# Patient Record
Sex: Female | Born: 1950 | Race: White | Hispanic: No | Marital: Single | Smoking: Former smoker
Health system: Southern US, Community
[De-identification: ages and names within clinical notes are randomized; demographics above are authoritative.]

## PROBLEM LIST (undated history)

## (undated) DIAGNOSIS — K859 Acute pancreatitis without necrosis or infection, unspecified: Secondary | ICD-10-CM

## (undated) DIAGNOSIS — K469 Unspecified abdominal hernia without obstruction or gangrene: Secondary | ICD-10-CM

## (undated) DIAGNOSIS — E119 Type 2 diabetes mellitus without complications: Secondary | ICD-10-CM

## (undated) HISTORY — DX: Type 2 diabetes mellitus without complications: E11.9

## (undated) HISTORY — PX: ABDOMINAL HYSTERECTOMY: SHX81

## (undated) HISTORY — PX: BASAL CELL CARCINOMA EXCISION: SHX1214

## (undated) HISTORY — DX: Acute pancreatitis without necrosis or infection, unspecified: K85.90

## (undated) HISTORY — DX: Unspecified abdominal hernia without obstruction or gangrene: K46.9

---

## 2003-11-12 ENCOUNTER — Other Ambulatory Visit: Admission: RE | Admit: 2003-11-12 | Discharge: 2003-11-12 | Payer: Self-pay | Admitting: Obstetrics and Gynecology

## 2004-02-11 ENCOUNTER — Ambulatory Visit (HOSPITAL_COMMUNITY): Admission: RE | Admit: 2004-02-11 | Discharge: 2004-02-11 | Payer: Self-pay | Admitting: Obstetrics and Gynecology

## 2004-05-04 ENCOUNTER — Observation Stay (HOSPITAL_COMMUNITY): Admission: RE | Admit: 2004-05-04 | Discharge: 2004-05-05 | Payer: Self-pay | Admitting: Obstetrics and Gynecology

## 2004-05-11 ENCOUNTER — Inpatient Hospital Stay (HOSPITAL_COMMUNITY): Admission: AD | Admit: 2004-05-11 | Discharge: 2004-05-16 | Payer: Self-pay | Admitting: Obstetrics and Gynecology

## 2006-06-21 ENCOUNTER — Encounter: Admission: RE | Admit: 2006-06-21 | Discharge: 2006-06-21 | Payer: Self-pay | Admitting: Obstetrics and Gynecology

## 2010-08-01 ENCOUNTER — Encounter: Payer: Self-pay | Admitting: Obstetrics and Gynecology

## 2011-10-25 ENCOUNTER — Other Ambulatory Visit: Payer: Self-pay | Admitting: Dermatology

## 2013-10-30 ENCOUNTER — Other Ambulatory Visit: Payer: Self-pay | Admitting: Dermatology

## 2014-04-07 ENCOUNTER — Other Ambulatory Visit: Payer: Self-pay | Admitting: Dermatology

## 2016-06-20 ENCOUNTER — Ambulatory Visit: Payer: Self-pay

## 2016-06-21 ENCOUNTER — Ambulatory Visit (INDEPENDENT_AMBULATORY_CARE_PROVIDER_SITE_OTHER): Payer: BLUE CROSS/BLUE SHIELD

## 2016-06-21 ENCOUNTER — Ambulatory Visit (INDEPENDENT_AMBULATORY_CARE_PROVIDER_SITE_OTHER): Payer: BLUE CROSS/BLUE SHIELD | Admitting: Family Medicine

## 2016-06-21 ENCOUNTER — Telehealth: Payer: Self-pay | Admitting: Family Medicine

## 2016-06-21 ENCOUNTER — Encounter (HOSPITAL_COMMUNITY): Payer: Self-pay

## 2016-06-21 ENCOUNTER — Ambulatory Visit (HOSPITAL_COMMUNITY)
Admission: RE | Admit: 2016-06-21 | Discharge: 2016-06-21 | Disposition: A | Discharge status: Home / Self Care | Payer: BLUE CROSS/BLUE SHIELD | Source: Ambulatory Visit | Attending: Family Medicine | Admitting: Family Medicine

## 2016-06-21 VITALS — BP 128/68 | HR 104 | Temp 97.8°F | Resp 18 | Ht 64.0 in | Wt 192.2 lb

## 2016-06-21 DIAGNOSIS — R1032 Left lower quadrant pain: Secondary | ICD-10-CM

## 2016-06-21 DIAGNOSIS — R935 Abnormal findings on diagnostic imaging of other abdominal regions, including retroperitoneum: Secondary | ICD-10-CM | POA: Insufficient documentation

## 2016-06-21 DIAGNOSIS — K559 Vascular disorder of intestine, unspecified: Secondary | ICD-10-CM

## 2016-06-21 DIAGNOSIS — Q438 Other specified congenital malformations of intestine: Secondary | ICD-10-CM

## 2016-06-21 DIAGNOSIS — I998 Other disorder of circulatory system: Secondary | ICD-10-CM

## 2016-06-21 DIAGNOSIS — K529 Noninfective gastroenteritis and colitis, unspecified: Secondary | ICD-10-CM

## 2016-06-21 LAB — POCT CBC
Granulocyte percent: 76.2 %G (ref 37–80)
HCT, POC: 42.5 % (ref 37.7–47.9)
Hemoglobin: 15 g/dL (ref 12.2–16.2)
Lymph, poc: 1.6 (ref 0.6–3.4)
MCH: 30.2 pg (ref 27–31.2)
MCHC: 35.3 g/dL (ref 31.8–35.4)
MCV: 85.4 fL (ref 80–97)
MID (cbc): 0.9 (ref 0–0.9)
MPV: 7.4 fL (ref 0–99.8)
POC Granulocyte: 7.9 — AB (ref 2–6.9)
POC LYMPH PERCENT: 15.2 %L (ref 10–50)
POC MID %: 8.6 %M (ref 0–12)
Platelet Count, POC: 337 10*3/uL (ref 142–424)
RBC: 4.98 M/uL (ref 4.04–5.48)
RDW, POC: 13.6 %
WBC: 10.4 10*3/uL — AB (ref 4.6–10.2)

## 2016-06-21 LAB — COMPREHENSIVE METABOLIC PANEL
ALT: 14 IU/L (ref 0–32)
AST: 11 IU/L (ref 0–40)
Albumin/Globulin Ratio: 1.5 (ref 1.2–2.2)
Albumin: 4.7 g/dL (ref 3.6–4.8)
Alkaline Phosphatase: 68 IU/L (ref 39–117)
BILIRUBIN TOTAL: 0.5 mg/dL (ref 0.0–1.2)
BUN/Creatinine Ratio: 17 (ref 12–28)
BUN: 12 mg/dL (ref 8–27)
CO2: 25 mmol/L (ref 18–29)
Calcium: 9.6 mg/dL (ref 8.7–10.3)
Chloride: 101 mmol/L (ref 96–106)
Creatinine, Ser: 0.69 mg/dL (ref 0.57–1.00)
GFR calc Af Amer: 107 mL/min/{1.73_m2} (ref >59)
GFR calc non Af Amer: 93 mL/min/{1.73_m2} (ref >59)
GLUCOSE: 158 mg/dL — AB (ref 65–99)
Globulin, Total: 3.2 g/dL (ref 1.5–4.5)
Potassium: 4.1 mmol/L (ref 3.5–5.2)
Sodium: 138 mmol/L (ref 134–144)
Total Protein: 7.9 g/dL (ref 6.0–8.5)

## 2016-06-21 LAB — LIPASE: Lipase: 18 U/L (ref 14–72)

## 2016-06-21 LAB — AMYLASE: Amylase: 30 U/L — ABNORMAL LOW (ref 31–124)

## 2016-06-21 MED ORDER — IOPAMIDOL (ISOVUE-300) INJECTION 61%
INTRAVENOUS | Status: AC
Start: 2016-06-21 — End: 2016-06-21
  Administered 2016-06-21: 100 mL
  Filled 2016-06-21: qty 100

## 2016-06-21 MED ORDER — IOPAMIDOL (ISOVUE-300) INJECTION 61%
INTRAVENOUS | Status: AC
  Administered 2016-06-21: 30 mL via ORAL
  Filled 2016-06-21: qty 30

## 2016-06-21 MED ORDER — SODIUM CHLORIDE 0.9 % IJ SOLN
INTRAMUSCULAR | Status: AC
  Filled 2016-06-21: qty 50

## 2016-06-21 MED ORDER — TRAMADOL HCL 50 MG PO TABS: 100.0000 mg | ORAL_TABLET | Freq: Three times a day (TID) | ORAL | 0 refills | Status: DC | PRN

## 2016-06-21 NOTE — Patient Instructions (Signed)
     IF you received an x-ray today, you will receive an invoice from [facility]. Please contact [facility] at [phone] with questions or concerns regarding your invoice.   IF you received labwork today, you will receive an invoice from [facility]. Please contact [facility] at [phone] with questions or concerns regarding your invoice.   Our billing staff will not be able to assist you with questions regarding bills from these companies.  You will be contacted with the lab results as soon as they are available. The fastest way to get your results is to activate your My Chart account. Instructions are located on the last page of this paperwork. If you have not heard from us regarding the results in 2 weeks, please contact this office.      

## 2016-06-21 NOTE — Progress Notes (Signed)
Patient ID: Kimberly Brown, female    DOB: [birth year], 65 y.o.   MRN: [id number]  PCP: No primary care provider on file.  Chief Complaint  Patient presents with  . Abdominal Pain    started Saturday, worse Sunday, nauseated, haven't eaten since Sunday morning  lower left side    Subjective:   HPI 65 year old female presents for evaluation of abdominal pain x 4 days. Pt is new to [facility]. Reports LLQ pain aggravated by movement. She began to experience nausea on Sunday and placed her self on clear liquid diet only and mostly drinked only water. Reports abdomen distended. Last bowel movement Sunday regular without diarrhea. Bowels have not moved since. Reports a subjective fever Sunday night. She has only tolerated lying the bed over the last two days. Completely fatigued and sharp pains occur with any type of movement. Standing up in one position improves pain. Mother hx of rectal cancer with METS. Pt is 54 and reports no px colonoscopy.   Social History   Social History  . Marital status: Divorced    Spouse name: N/A  . Number of children: N/A  . Years of education: N/A   Occupational History  . Not on file.   Social History Main Topics  . Smoking status: Never Smoker  . Smokeless tobacco: Never Used  . Alcohol use Not on file  . Drug use: Unknown  . Sexual activity: Not on file   Other Topics Concern  . Not on file   Social History Narrative  . No narrative on file    History reviewed. No pertinent family history.  Review of Systems See HPI There are no active problems to display for this patient.  Prior to Admission medications   Not on File  No Known Allergies     Objective:  Physical Exam  Constitutional: She is oriented to person, place, and time. She appears well-developed and well-nourished.  HENT:  Head: Normocephalic.  Eyes: Conjunctivae and EOM are normal. Pupils are equal, round, and reactive to light.  Neck: Normal range of motion. Neck supple.   Cardiovascular: Regular rhythm, normal heart sounds and intact distal pulses.   Pulmonary/Chest: Breath sounds normal.  Abdominal: She exhibits distension. Bowel sounds are decreased. There is no hepatosplenomegaly. There is tenderness in the left lower quadrant. There is rigidity and rebound. There is no guarding, no CVA tenderness, no tenderness at McBurney's point and negative Murphy's sign. No hernia.    Left middle lower quadrant increased pain with light palpation  Musculoskeletal: Normal range of motion.  Neurological: She is alert and oriented to person, place, and time.  Skin: Skin is warm and dry.  Psychiatric: She has a normal mood and affect. Her behavior is normal. Judgment and thought content normal.    Vitals:   06/21/16 1001  BP: 128/68  Pulse: (!) 104  Resp: 18  Temp: 97.8 F (36.6 C)    Assessment & Plan:  1. Left lower quadrant pain - POCT CBC - Lipase - Amylase - DG Abd 2 Views; Future - Comprehensive metabolic panel - CT Abdomen Pelvis W Contrast Ct Abdomen Pelvis W Contrast  Result Date: 06/21/2016 CLINICAL DATA:  65 year old female with left lower quadrant pain aggravated by movement. Abdominal distention. Nausea. EXAM: CT ABDOMEN AND PELVIS WITH CONTRAST TECHNIQUE: Multidetector CT imaging of the abdomen and pelvis was performed using the standard protocol following bolus administration of intravenous contrast. CONTRAST:  1 ISOVUE-300 IOPAMIDOL (ISOVUE-300) INJECTION 61% COMPARISON:  CT  the abdomen and pelvis 05/12/2004. FINDINGS: Lower chest: Unremarkable. Hepatobiliary: No cystic or solid hepatic lesions. No intra or extrahepatic biliary ductal dilatation. Gallbladder is normal in appearance. Pancreas: No pancreatic mass. No pancreatic ductal dilatation. No pancreatic or peripancreatic fluid or inflammatory changes. Spleen: Unremarkable. Adrenals/Urinary Tract: 2.6 cm low-attenuation lesion in the lower pole the left kidney is compatible with a simple cyst.  subcentimeter low-attenuation lesion in the interpolar region of the left kidney is too small to characterize, but is statistically likely to represent a tiny cyst. Right kidney and bilateral adrenal glands are normal in appearance. There is no hydroureteronephrosis or perinephric stranding to indicate urinary tract obstruction at this time. Urinary bladder is nearly completely decompressed, but otherwise unremarkable in appearance. Stomach/Bowel: The appearance of the stomach is normal. There is no pathologic dilatation of small bowel or colon. Normal appendix. In the left lower quadrant anterior to the distal descending colon there is an area of fatty attenuation circumscribed of by some inflammatory changes, best appreciated on axial image 48 of series 2 and coronal image 33 of series 3, most compatible with an area of fat necrosis, presumably from epiploic appendagitis. Vascular/Lymphatic: No significant atherosclerotic disease, aneurysm or dissection identified in the abdominal or pelvic vasculature. No lymphadenopathy noted in the abdomen or pelvis. Reproductive: Status post hysterectomy. Ovaries are not confidently identified may be surgically absent or atrophic. Other: No significant volume of ascites.  No pneumoperitoneum. Musculoskeletal: There are no aggressive appearing lytic or blastic lesions noted in the visualized portions of the skeleton. IMPRESSION: 1. Findings in the left lower quadrant are most compatible with inflammation and fat necrosis secondary to epiploic appendagitis, as detailed above. 2. Normal appendix. 3. Additional incidental findings, as above. Electronically Signed   By: Vinnie Langton M.D.   On: 06/21/2016 16:47   Dg Abd 2 Views  Result Date: 06/21/2016 CLINICAL DATA:  65 year old female with left lower quadrant abdominal pain. Initial encounter. EXAM: ABDOMEN - 2 VIEW COMPARISON:  CT Abdomen and Pelvis 05/12/2004. FINDINGS: Upright and supine views. Lung bases appear within  normal limits. No pneumoperitoneum. Non obstructed bowel gas pattern. Abdominal and pelvic visceral contours are within normal limits. Lower lumbar facet hypertrophy. No acute osseous abnormality identified. IMPRESSION: Negative.  Normal bowel gas pattern, no free air. Electronically Signed   By: Genevie Ann M.D.   On: 06/21/2016 11:14   - Ambulatory referral to General Surgery- due to CT noted  Epiploic Appendagitis with fat necrosis. Further evaluation by G.S needed. -Pain Management -Tramadol 50 mg every 8 hours as needed.  Carroll Sage. Kenton Kingfisher, MSN, FNP-C [facility]

## 2016-06-22 ENCOUNTER — Telehealth: Payer: Self-pay

## 2016-06-22 NOTE — Telephone Encounter (Signed)
erroneous

## 2016-06-22 NOTE — Telephone Encounter (Signed)
Uncertain of what happened to my original phone note. Pt was contacted and notified of CT scan results on 06/21/16 after 7:00 pm. She was advise at that time that I was urgently referring her to General Surgery due to epiploic appendagitis which was noted on her CT scan. We faxed over a prescription for Tramadol to her pharmacy which she was also notified of. I am out of the office tomorrow and only saw this note as I am completing my charts. Could you please phone patient and determine whether or not she had picked up the tramadol. Also I advised her if she hasn't heard back from General Surgery by Thursday to please ask to speak with Candie Chroman for follow-up. This could have been taken care of earlier today but this message was sent to the pool oppose to being routed directly to me.  Carroll Sage. Kenton Kingfisher, MSN, FNP-C [facility]

## 2016-06-22 NOTE — Telephone Encounter (Signed)
Pt is wanting to know that with the mri results can she take motrin for pain   Best number [phone]

## 2016-06-23 ENCOUNTER — Telehealth: Payer: Self-pay | Admitting: Emergency Medicine

## 2016-06-23 LAB — COMPREHENSIVE METABOLIC PANEL

## 2016-06-23 NOTE — Telephone Encounter (Signed)
Patient is returning a missed phone call from [place].  [phone]

## 2016-06-23 NOTE — Telephone Encounter (Signed)
Pt does not want to take Tramadol, advised its  fine to take Motrin. Advised to call me back by the end of the day if no call from General Surgery

## 2016-06-23 NOTE — Telephone Encounter (Signed)
Left a detailed message advising patient to return call if she have not heard from General Surgery by today.

## 2016-09-14 ENCOUNTER — Ambulatory Visit (HOSPITAL_COMMUNITY)
Admission: RE | Admit: 2016-09-14 | Discharge: 2016-09-14 | Disposition: A | Discharge status: Home / Self Care | Payer: BLUE CROSS/BLUE SHIELD | Source: Ambulatory Visit | Attending: Family Medicine | Admitting: Family Medicine

## 2016-09-14 ENCOUNTER — Ambulatory Visit (INDEPENDENT_AMBULATORY_CARE_PROVIDER_SITE_OTHER): Payer: BLUE CROSS/BLUE SHIELD | Admitting: Family Medicine

## 2016-09-14 ENCOUNTER — Telehealth: Payer: Self-pay | Admitting: Family Medicine

## 2016-09-14 VITALS — BP 130/80 | HR 80 | Temp 98.2°F | Resp 16 | Ht 64.0 in | Wt 187.4 lb

## 2016-09-14 DIAGNOSIS — R42 Dizziness and giddiness: Secondary | ICD-10-CM

## 2016-09-14 NOTE — Progress Notes (Signed)
Patient ID: Kimberly Brown, female    DOB: 10/25/1950, 66 y.o.   MRN: [id number]  PCP: No primary care provider on file.  Chief Complaint  Patient presents with  . Dizziness    x4 days; was told by a friend it may be due to a stroke  . Blurred Vision    Subjective:  HPI 66 year old female presents for evaluation of dizziness and blurred vision x 4 days. Saturday driving around. Eyes glazed over and blurry vision, blurred vision resolved after she rested for several hours. Continued pressure in back of head. Dizziness is still here today. No ear pressure or upper respiratory illness. Bending. Room is not spinning. Nausea present on Saturday and Sunday. Smoked maybe 10 years quit in early 20's. No alcohol. No real headache. Hx of migraine in early 20's but resolved.  Some mild imbalance. No upper respiratory. No vomiting. Dizziness is worst in morning been consistent. Improved since Saturday. "I don't feel right".  Social History   Social History  . Marital status: Divorced    Spouse name: N/A  . Number of children: N/A  . Years of education: N/A   Occupational History  . Not on file.   Social History Main Topics  . Smoking status: Never Smoker  . Smokeless tobacco: Never Used  . Alcohol use Not on file  . Drug use: Unknown  . Sexual activity: Not on file   Other Topics Concern  . Not on file   Social History Narrative  . No narrative on file   Review of Systems SEE HPI  Prior to Admission medications   Not on File    Past Medical, Surgical Family and Social History reviewed and updated.    Objective:   Today's Vitals   09/14/16 1142  BP: 130/80  Pulse: 80  Resp: 16  Temp: 98.2 F (36.8 C)  TempSrc: Oral  SpO2: 94%  Weight: 187 lb 6.4 oz (85 kg)  Height: 5\' 4"  (1.626 m)    Wt Readings from Last 3 Encounters:  09/14/16 187 lb 6.4 oz (85 kg)  06/21/16 192 lb 3.2 oz (87.2 kg)   Physical Exam  Constitutional: She is oriented to person, place, and  time. She appears well-developed and well-nourished.  HENT:  Head: Normocephalic and atraumatic.  Right Ear: External ear normal.  Left Ear: External ear normal.  Nose: Nose normal.  Mouth/Throat: Oropharynx is clear and moist.  Eyes: Conjunctivae and EOM are normal. Pupils are equal, round, and reactive to light.  Neck: Normal range of motion. Neck supple.  Cardiovascular: Normal rate, regular rhythm, normal heart sounds and intact distal pulses.   Pulmonary/Chest: Effort normal and breath sounds normal.  Musculoskeletal: Normal range of motion.  Neurological: She is alert and oriented to person, place, and time. She has normal strength. No sensory deficit. She displays a negative Romberg sign. GCS eye subscore is 4. GCS verbal subscore is 5. GCS motor subscore is 6.  Skin: Skin is warm and dry.  Psychiatric: She has a normal mood and affect. Her behavior is normal. Judgment and thought content normal.    Ct Head Wo Contrast  Result Date: 09/14/2016 CLINICAL DATA:  Blurred vision, dizziness abdominal pressure in the occiput foot over the last 5 days, history of migraine EXAM: CT HEAD WITHOUT CONTRAST TECHNIQUE: Contiguous axial images were obtained from the base of the skull through the vertex without intravenous contrast. COMPARISON:  None. FINDINGS: Brain: The ventricular system is within normal limits  in size for age and the septum is midline in position. The fourth ventricle and basilar cisterns are unremarkable. No hemorrhage, mass lesion, or acute infarction is seen. Vascular: No vascular abnormality is seen on this unenhanced study. Skull: On bone window images, no calvarial abnormality is seen. Sinuses/Orbits: The paranasal sinuses are well pneumatized. Other: None. IMPRESSION: Negative unenhanced CT of the brain. Electronically Signed   By: Ivar Drape M.D.   On: 09/14/2016 15:30   Assessment & Plan:  1. Dizziness - EKG 12-Lead - CT Head Wo Contrast- Unremarkable   - Ambulatory  referral to Neurology-urgent referral    Carroll Sage. Kenton Kingfisher, MSN, FNP-C [facility]

## 2016-09-14 NOTE — Patient Instructions (Addendum)
-Your CT Scan has been set-up at [facility]. Go through the ED entrance and report to radiology.  -If your CT is negative, I will have you follow-up emergently with a Neurologist for further evaluation.   IF you received an x-ray today, you will receive an invoice from [facility]. Please contact [facility] at [phone] with questions or concerns regarding your invoice.   IF you received labwork today, you will receive an invoice from [facility]. Please contact [facility] at [phone] with questions or concerns regarding your invoice.   Our billing staff will not be able to assist you with questions regarding bills from these companies.  You will be contacted with the lab results as soon as they are available. The fastest way to get your results is to activate your My Chart account. Instructions are located on the last page of this paperwork. If you have not heard from [place] regarding the results in 2 weeks, please contact this office.    We recommend that you schedule a mammogram for breast cancer screening. Typically, you do not need a referral to do this. Please contact a local imaging center to schedule your mammogram.  [facility] - [phone]  *ask for the Radiology Department [facility] - [phone] or [phone]  [facility] - [phone] [place] [phone] [facility] - [phone]  *ask for the [facility] - [phone]  *ask for the Radiology Department [facility] - [phone]  *ask for the [facility] - [phone]    Transient Ischemic Attack A transient ischemic attack (TIA) is a "warning stroke" that causes stroke-like symptoms. A TIA does not cause lasting damage to the brain. The symptoms of a TIA can happen fast and do not last long. It is important to know the symptoms of a  TIA and what to do. This can help prevent stroke or death. Follow these instructions at home:  Take medicines only as told by your doctor. Make sure you understand all of the instructions.  You may need to take aspirin or warfarin medicine. Warfarin needs to be taken exactly as told.  Taking too much or too little warfarin is dangerous. Blood tests must be done as often as told by your doctor. A PT blood test measures how long it takes for blood to clot. Your PT is used to calculate another value called an INR. Your PT and INR help your doctor adjust your warfarin dosage. He or she will make sure you are taking the right amount.  Food can cause problems with warfarin and affect the results of your blood tests. This is true for foods high in vitamin K. Eat the same amount of foods high in vitamin K each day. Foods high in vitamin K include spinach, kale, broccoli, cabbage, collard and turnip greens, Brussels sprouts, peas, cauliflower, seaweed, and parsley. Other foods high in vitamin K include beef and pork liver, green tea, and soybean oil. Eat the same amount of foods high in vitamin K each day. Avoid big changes in your diet. Tell your doctor before changing your diet. Talk to a food specialist (dietitian) if you have questions.  Many medicines can cause problems with warfarin and affect your PT and INR. Tell your doctor about all medicines you take. This includes vitamins and dietary pills (supplements). Do not take or stop taking any prescribed or over-the-counter medicines unless your  doctor tells you to.  Warfarin can cause more bruising or bleeding. Hold pressure over any cuts for longer than normal. Talk to your doctor about other side effects of warfarin.  Avoid sports or activities that may cause injury or bleeding.  Be careful when you shave, floss, or use sharp objects.  Avoid or drink very little alcohol while taking warfarin. Tell your doctor if you change how much alcohol you  drink.  Tell your dentist and other doctors that you take warfarin before any procedures.  Follow your diet program as told, if you are given one.  Keep a healthy weight.  Stay active. Try to get at least 30 minutes of activity on all or most days.  Do not use any tobacco products, including cigarettes, chewing tobacco, or electronic cigarettes. If you need help quitting, ask your doctor.  Limit alcohol intake to no more than 1 drink per day for nonpregnant women and 2 drinks per day for men. One drink equals 12 ounces of beer, 5 ounces of wine, or 1 ounces of hard liquor.  Do not abuse drugs.  Keep your home safe so you do not fall. You can do this by:  Putting grab bars in the bedroom and bathroom.  Raising toilet seats.  Putting a seat in the shower.  Keep all follow-up visits as told by your doctor. This is important. Contact a doctor if:  Your personality changes.  You have trouble swallowing.  You have double vision.  You are dizzy.  You have a fever. Get help right away if: These symptoms may be an emergency. Do not wait to see if the symptoms will go away. Get medical help right away. Call your local emergency services (911 in the [place] Do not drive yourself to the hospital.  You have sudden weakness or lose feeling (go numb), especially on one side of the body. This can affect your:  Face.  Arm.  Leg.  You have sudden trouble walking.  You have sudden trouble moving your arms or legs.  You have sudden confusion.  You have trouble talking.  You have trouble understanding.  You have sudden trouble seeing in one or both eyes.  You lose your balance.  Your movements are not smooth.  You have a sudden, very bad headache with no known cause.  You have new chest pain.  Your heartbeat is unsteady.  You are partly or totally unaware of what is going on around you. This information is not intended to replace advice given to you by your health  care provider. Make sure you discuss any questions you have with your health care provider. Document Released: 04/05/2008 Document Revised: 02/29/2016 Document Reviewed: 10/02/2013 Elsevier Interactive Patient Education  2017 [employer].

## 2016-09-14 NOTE — Telephone Encounter (Signed)
Left patient a voicemail advise of negative CT results. I am referring her urgently to neurology for further workup of dizziness.   [place] called while I was leaving message and was transferring me to patient who was in lobby and phone was disconnected.  If patient wants to speak with PLEASE PAGE ME!

## 2016-09-15 ENCOUNTER — Telehealth: Payer: Self-pay | Admitting: Family Medicine
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
## 2016-09-20 ENCOUNTER — Ambulatory Visit: Payer: BLUE CROSS/BLUE SHIELD | Admitting: Neurology

## 2016-09-27 ENCOUNTER — Telehealth: Payer: Self-pay

## 2016-09-27 NOTE — Telephone Encounter (Signed)
Called pt to offer r/s of new patient appt tomorrow morning in case of bad weather. May call back if delayed d/t snow.

## 2016-09-28 ENCOUNTER — Encounter: Payer: Self-pay | Admitting: Neurology

## 2016-09-28 ENCOUNTER — Ambulatory Visit (INDEPENDENT_AMBULATORY_CARE_PROVIDER_SITE_OTHER): Payer: BLUE CROSS/BLUE SHIELD | Admitting: Neurology

## 2016-09-28 VITALS — BP 130/64 | HR 87 | Resp 20 | Ht 64.0 in | Wt 189.0 lb

## 2016-09-28 DIAGNOSIS — H538 Other visual disturbances: Secondary | ICD-10-CM | POA: Diagnosis not present

## 2016-09-28 DIAGNOSIS — H532 Diplopia: Secondary | ICD-10-CM

## 2016-09-28 DIAGNOSIS — R42 Dizziness and giddiness: Secondary | ICD-10-CM | POA: Diagnosis not present

## 2016-09-28 DIAGNOSIS — R55 Syncope and collapse: Secondary | ICD-10-CM

## 2016-09-28 DIAGNOSIS — R2689 Other abnormalities of gait and mobility: Secondary | ICD-10-CM

## 2016-09-28 DIAGNOSIS — Z87891 Personal history of nicotine dependence: Secondary | ICD-10-CM
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
## 2016-09-29 LAB — LIPID PANEL
CHOL/HDL RATIO: 3.8 ratio (ref 0.0–4.4)
Cholesterol, Total: 219 mg/dL — ABNORMAL HIGH (ref 100–199)
HDL: 58 mg/dL (ref >39)
LDL Calculated: 143 mg/dL — ABNORMAL HIGH (ref 0–99)
Triglycerides: 89 mg/dL (ref 0–149)
VLDL Cholesterol Cal: 18 mg/dL (ref 5–40)

## 2016-09-29 LAB — HEMOGLOBIN A1C
Est. average glucose Bld gHb Est-mCnc: 154 mg/dL
Hgb A1c MFr Bld: 7 % — ABNORMAL HIGH (ref 4.8–5.6)

## 2016-09-29 LAB — CREATININE, SERUM
CREATININE: 0.73 mg/dL (ref 0.57–1.00)
GFR calc Af Amer: 101 mL/min/{1.73_m2} (ref >59)
GFR calc non Af Amer: 88 mL/min/{1.73_m2} (ref >59)

## 2016-09-29 LAB — BUN: BUN: 9 mg/dL (ref 8–27)

## 2016-10-03 ENCOUNTER — Telehealth: Payer: Self-pay | Admitting: Neurology

## 2016-10-03 ENCOUNTER — Telehealth: Payer: Self-pay | Admitting: *Deleted

## 2016-10-03 NOTE — Telephone Encounter (Signed)
Patient called in this morning and canceled her MRI that she had scheduled for this Wednesday at our [place] mobile unit.Kimberly Brown She wanted Dr. Jaynee Eagles to be aware of this.

## 2016-10-03 NOTE — Telephone Encounter (Signed)
Per Dr Jaynee Eagles, spoke with patient and informed her that her labs showed her cholesterol is elevated, and her LDL is 143. Advised her Dr Jaynee Eagles would like it to be closer to 57. Advised Dr Jaynee Eagles would recommend a statin. Also advised  she is diabetic with HgbA1c of 7 and needs to follow up with her primary care for treatment. Patient questioned normal values of above labs; information given. Verified her PCP as Sherre Scarlet, NP, [place] and advised pt this RN weill fax results to her per Dr Jaynee Eagles. Patient verbalized understanding, appreciation.  Labs faxed.

## 2016-10-03 NOTE — Telephone Encounter (Signed)
Patient is returning your call.  

## 2016-10-03 NOTE — Telephone Encounter (Signed)
LVM requesting call back re: lab results.  

## 2016-10-05 ENCOUNTER — Other Ambulatory Visit: Payer: BLUE CROSS/BLUE SHIELD

## 2016-12-08 ENCOUNTER — Encounter: Payer: Self-pay | Admitting: Family Medicine

## 2016-12-08 ENCOUNTER — Ambulatory Visit (INDEPENDENT_AMBULATORY_CARE_PROVIDER_SITE_OTHER): Payer: BLUE CROSS/BLUE SHIELD | Admitting: Family Medicine

## 2016-12-08 VITALS — BP 111/69 | HR 76 | Temp 97.6°F | Resp 16 | Ht 64.0 in | Wt 171.4 lb

## 2016-12-08 DIAGNOSIS — R634 Abnormal weight loss: Secondary | ICD-10-CM

## 2016-12-08 DIAGNOSIS — E119 Type 2 diabetes mellitus without complications: Secondary | ICD-10-CM | POA: Diagnosis not present

## 2016-12-08 DIAGNOSIS — R42 Dizziness and giddiness: Secondary | ICD-10-CM | POA: Diagnosis not present

## 2016-12-08 DIAGNOSIS — E7800 Pure hypercholesterolemia, unspecified: Secondary | ICD-10-CM

## 2016-12-08 LAB — POCT GLYCOSYLATED HEMOGLOBIN (HGB A1C): Hemoglobin A1C: 6.2

## 2016-12-08 MED ORDER — BLOOD GLUCOSE MONITOR KIT: PACK | 0 refills | Status: AC

## 2016-12-08 NOTE — Patient Instructions (Signed)
     IF you received an x-ray today, you will receive an invoice from [facility]. Please contact [facility] at [phone] with questions or concerns regarding your invoice.   IF you received labwork today, you will receive an invoice from [facility]. Please contact [facility] at [phone] with questions or concerns regarding your invoice.   Our billing staff will not be able to assist you with questions regarding bills from these companies.  You will be contacted with the lab results as soon as they are available. The fastest way to get your results is to activate your My Chart account. Instructions are located on the last page of this paperwork. If you have not heard from us regarding the results in 2 weeks, please contact this office.     

## 2016-12-08 NOTE — Progress Notes (Signed)
Subjective:    Patient ID: Kimberly Brown, female    DOB: 10-09-1950, 66 y.o.   MRN: [id number] Chief Complaint  Patient presents with  . Diabetes    here to get her blood sugar checked (A1C)    HPI  Kimberly Brown is a 66 yo woman who made an appointment today for "blood work." She is a new patient to me who has only been seen twice for acute issues at our office though she considers as her primary care and has multiple medical problems. 2 months ago she was referred to neurology for dizziness. On Dr. Cathren Laine, neurology, evaluation her cholesterol was elevated and she was diagnosed with diabetes with instructions to follow-up for further management with her PCP for which I assume she is here today. She is not on any precription medications. Reporets she did have negative orthostatics   DMII: new diagnosis sev mos prior.  She did some Radiographer, therapeutic ([facility], [facility],). She eliminated candy, chocolate, cookies, and cut back on pasta, switched to wild rice from sushi rice, she cut back on fruit and started juicing kale/carrots.  She is paying attention to how she felt when she overate. She was feeling well and then had a smaller episode of dizziness (than prior) after some emesis after feeling like she had some food impaction in the RUQ which was new but passed in a day and in the past several days she has felt tired, just slightly dizzy when getting out of bed to quickly (which had resolved for the past 2 mos until now). Blurred vision totally resolved - saw Dr. Sabra Heck and no new rx needed (was in March right after the blurred vision started.)  Appetite a little lower.  She is drinking a lot water - 6 glasses a day - in effort to by healthy but denies polydypsia.   Lab Results  Component Value Date   HGBA1C 7.0 (H) 09/28/2016   S/p hysterectomy in 2005 and bladder has been horrible since then so unable to assess bladder dysfunction.   HLD:  SHe has eliminated cheese, milk, yogurt, ice cream,  butter.   Obesity BMI 32: Has lost 18 lbs since last visit!!! Restart tai chi. Has started walking and stretching. Stopped doing rigorous  Past Medical History:  Diagnosis Date  . Hernia of abdominal cavity    Past Surgical History:  Procedure Laterality Date  . ABDOMINAL HYSTERECTOMY    . BASAL CELL CARCINOMA EXCISION     No current outpatient prescriptions on file prior to visit.   No current facility-administered medications on file prior to visit.    No Known Allergies Family History  Problem Relation Age of Onset  . Kidney failure Sister   . Stroke Neg Hx    Social History   Social History  . Marital status: Divorced    Spouse name: N/A  . Number of children: N/A  . Years of education: N/A   Social History Main Topics  . Smoking status: Never Smoker  . Smokeless tobacco: Never Used  . Alcohol use None  . Drug use: Unknown  . Sexual activity: Not Asked   Other Topics Concern  . None   Social History Narrative  . None   Depression screen [facility] 2/9 12/08/2016 09/14/2016 06/21/2016  Decreased Interest 0 0 0  Down, Depressed, Hopeless 0 0 0  PHQ - 2 Score 0 0 0     Review of Systems See hpi    Objective:   Physical  Exam  Constitutional: She is oriented to person, place, and time. She appears well-developed and well-nourished. No distress.  HENT:  Head: Normocephalic and atraumatic.  Right Ear: External ear normal.  Left Ear: External ear normal.  Eyes: Conjunctivae are normal. No scleral icterus.  Neck: Normal range of motion. Neck supple. No thyromegaly present.  Cardiovascular: Normal rate, regular rhythm, normal heart sounds and intact distal pulses.   Pulmonary/Chest: Effort normal and breath sounds normal. No respiratory distress.  Musculoskeletal: She exhibits no edema.  Lymphadenopathy:    She has no cervical adenopathy.  Neurological: She is alert and oriented to person, place, and time.  Skin: Skin is warm and dry. She is not diaphoretic. No  erythema.  Psychiatric: She has a normal mood and affect. Her behavior is normal.    Diabetic Foot Exam - Simple   Simple Foot Form Diabetic Foot exam was performed with the following findings:  Yes 12/08/2016  2:10 PM  Visual Inspection No deformities, no ulcerations, no other skin breakdown bilaterally:  Yes Sensation Testing Intact to touch and monofilament testing bilaterally:  Yes Pulse Check Posterior Tibialis and Dorsalis pulse intact bilaterally:  Yes Comments      BP 111/69   Pulse 76   Temp 97.6 F (36.4 C) (Oral)   Resp 16   Ht 5' 4"  (1.626 m)   Wt 171 lb 6.4 oz (77.7 kg)   SpO2 96%   BMI 29.42 kg/m     Assessment & Plan:  Refer to optho Pneumovax, TDaP Hep C, HIV, tsh - if do labs, recheck cmp Do foot exam 1. Type 2 diabetes mellitus without complication, without long-term current use of insulin ([place])   2. Pure hypercholesterolemia   3. Dizziness and giddiness   4. Loss of weight     Orders Placed This Encounter  Procedures  . Microalbumin/Creatinine Ratio, Urine  . Comprehensive metabolic panel  . CBC with Differential/Platelet  . TSH  . CBC with Differential/Platelet  . TSH  . POCT glycosylated hemoglobin (Hb A1C)  . HM DIABETES FOOT EXAM    Meds ordered this encounter  Medications  . blood glucose meter kit and supplies KIT    Sig: Dispense based on patient and insurance preference. Use up to four times daily as directed. (FOR ICD-10 E11.9).    Dispense:  1 each    Refill:  0    Order Specific Question:   Number of strips    Answer:   1000    Order Specific Question:   Number of lancets    Answer:   [place], M.D.  Primary Care at [facility] [address] [place], [place] [address] [phone] phone [phone] fax  12/10/16 10:49 PM

## 2016-12-09 LAB — CBC WITH DIFFERENTIAL/PLATELET
BASOS ABS: 0 10*3/uL (ref 0.0–0.2)
Basos: 0 %
EOS (ABSOLUTE): 0 10*3/uL (ref 0.0–0.4)
Eos: 0 %
HEMATOCRIT: 45.8 % (ref 34.0–46.6)
Hemoglobin: 14.8 g/dL (ref 11.1–15.9)
Immature Grans (Abs): 0 10*3/uL (ref 0.0–0.1)
Immature Granulocytes: 0 %
LYMPHS ABS: 2.1 10*3/uL (ref 0.7–3.1)
Lymphs: 26 %
MCH: 28.3 pg (ref 26.6–33.0)
MCHC: 32.3 g/dL (ref 31.5–35.7)
MCV: 88 fL (ref 79–97)
MONOCYTES: 9 %
Monocytes Absolute: 0.7 10*3/uL (ref 0.1–0.9)
Neutrophils Absolute: 5.2 10*3/uL (ref 1.4–7.0)
Neutrophils: 65 %
Platelets: 367 10*3/uL (ref 150–379)
RBC: 5.23 x10E6/uL (ref 3.77–5.28)
RDW: 13.9 % (ref 12.3–15.4)
WBC: 8 10*3/uL (ref 3.4–10.8)

## 2016-12-09 LAB — COMPREHENSIVE METABOLIC PANEL
ALBUMIN: 4.6 g/dL (ref 3.6–4.8)
ALT: 16 IU/L (ref 0–32)
AST: 10 IU/L (ref 0–40)
Albumin/Globulin Ratio: 1.7 (ref 1.2–2.2)
Alkaline Phosphatase: 68 IU/L (ref 39–117)
BUN/Creatinine Ratio: 25 (ref 12–28)
BUN: 13 mg/dL (ref 8–27)
Bilirubin Total: 0.6 mg/dL (ref 0.0–1.2)
CHLORIDE: 101 mmol/L (ref 96–106)
CO2: 21 mmol/L (ref 18–29)
Calcium: 9.5 mg/dL (ref 8.7–10.3)
Creatinine, Ser: 0.52 mg/dL — ABNORMAL LOW (ref 0.57–1.00)
GFR calc Af Amer: 118 mL/min/{1.73_m2} (ref >59)
GFR calc non Af Amer: 102 mL/min/{1.73_m2} (ref >59)
Globulin, Total: 2.7 g/dL (ref 1.5–4.5)
Glucose: 88 mg/dL (ref 65–99)
Potassium: 4 mmol/L (ref 3.5–5.2)
Sodium: 142 mmol/L (ref 134–144)
TOTAL PROTEIN: 7.3 g/dL (ref 6.0–8.5)

## 2016-12-09 LAB — MICROALBUMIN / CREATININE URINE RATIO
Creatinine, Urine: 305.6 mg/dL
Microalb/Creat Ratio: 8.6 mg/g creat (ref 0.0–30.0)
Microalbumin, Urine: 26.3 ug/mL

## 2016-12-09 LAB — TSH: TSH: 1.99 u[IU]/mL (ref 0.450–4.500)

## 2016-12-23 ENCOUNTER — Ambulatory Visit (INDEPENDENT_AMBULATORY_CARE_PROVIDER_SITE_OTHER): Payer: BLUE CROSS/BLUE SHIELD | Admitting: Physician Assistant

## 2016-12-23 ENCOUNTER — Emergency Department (HOSPITAL_COMMUNITY): Payer: BLUE CROSS/BLUE SHIELD

## 2016-12-23 ENCOUNTER — Telehealth: Payer: Self-pay | Admitting: Physician Assistant

## 2016-12-23 ENCOUNTER — Telehealth: Payer: Self-pay | Admitting: Family Medicine

## 2016-12-23 ENCOUNTER — Inpatient Hospital Stay (HOSPITAL_COMMUNITY)
Admission: EM | Admit: 2016-12-23 | Discharge: 2016-12-27 | DRG: 418 | Disposition: A | Discharge status: Home / Self Care | Payer: BLUE CROSS/BLUE SHIELD | Attending: Family Medicine | Admitting: Family Medicine

## 2016-12-23 ENCOUNTER — Ambulatory Visit (INDEPENDENT_AMBULATORY_CARE_PROVIDER_SITE_OTHER): Payer: BLUE CROSS/BLUE SHIELD

## 2016-12-23 ENCOUNTER — Encounter (HOSPITAL_COMMUNITY): Payer: Self-pay | Admitting: Emergency Medicine

## 2016-12-23 ENCOUNTER — Encounter: Payer: Self-pay | Admitting: Physician Assistant

## 2016-12-23 VITALS — BP 144/76 | HR 74 | Temp 98.9°F | Resp 18 | Ht 63.54 in | Wt 166.4 lb

## 2016-12-23 DIAGNOSIS — K810 Acute cholecystitis: Secondary | ICD-10-CM | POA: Diagnosis present

## 2016-12-23 DIAGNOSIS — K859 Acute pancreatitis without necrosis or infection, unspecified: Secondary | ICD-10-CM | POA: Diagnosis present

## 2016-12-23 DIAGNOSIS — Z85828 Personal history of other malignant neoplasm of skin: Secondary | ICD-10-CM

## 2016-12-23 DIAGNOSIS — Z87891 Personal history of nicotine dependence: Secondary | ICD-10-CM

## 2016-12-23 DIAGNOSIS — E785 Hyperlipidemia, unspecified: Secondary | ICD-10-CM | POA: Diagnosis present

## 2016-12-23 DIAGNOSIS — R112 Nausea with vomiting, unspecified: Secondary | ICD-10-CM | POA: Diagnosis not present

## 2016-12-23 DIAGNOSIS — R1084 Generalized abdominal pain: Secondary | ICD-10-CM

## 2016-12-23 DIAGNOSIS — K851 Biliary acute pancreatitis without necrosis or infection: Secondary | ICD-10-CM | POA: Diagnosis not present

## 2016-12-23 DIAGNOSIS — E1169 Type 2 diabetes mellitus with other specified complication: Secondary | ICD-10-CM | POA: Diagnosis present

## 2016-12-23 DIAGNOSIS — R9431 Abnormal electrocardiogram [ECG] [EKG]: Secondary | ICD-10-CM | POA: Diagnosis present

## 2016-12-23 DIAGNOSIS — R1013 Epigastric pain: Secondary | ICD-10-CM | POA: Diagnosis not present

## 2016-12-23 DIAGNOSIS — R198 Other specified symptoms and signs involving the digestive system and abdomen: Secondary | ICD-10-CM

## 2016-12-23 DIAGNOSIS — Z79899 Other long term (current) drug therapy: Secondary | ICD-10-CM

## 2016-12-23 DIAGNOSIS — K769 Liver disease, unspecified: Secondary | ICD-10-CM | POA: Diagnosis present

## 2016-12-23 DIAGNOSIS — E119 Type 2 diabetes mellitus without complications: Secondary | ICD-10-CM

## 2016-12-23 DIAGNOSIS — K8020 Calculus of gallbladder without cholecystitis without obstruction: Secondary | ICD-10-CM

## 2016-12-23 DIAGNOSIS — E876 Hypokalemia: Secondary | ICD-10-CM | POA: Diagnosis present

## 2016-12-23 DIAGNOSIS — Z90710 Acquired absence of both cervix and uterus: Secondary | ICD-10-CM

## 2016-12-23 LAB — COMPREHENSIVE METABOLIC PANEL
ALT: 22 U/L (ref 14–54)
AST: 25 U/L (ref 15–41)
Albumin: 4.6 g/dL (ref 3.5–5.0)
Alkaline Phosphatase: 57 U/L (ref 38–126)
Anion gap: 11 (ref 5–15)
BUN: 14 mg/dL (ref 6–20)
CO2: 23 mmol/L (ref 22–32)
Calcium: 9 mg/dL (ref 8.9–10.3)
Chloride: 109 mmol/L (ref 101–111)
Creatinine, Ser: 0.63 mg/dL (ref 0.44–1.00)
GFR calc Af Amer: >60 mL/min (ref >60)
GFR calc non Af Amer: >60 mL/min (ref >60)
Glucose, Bld: 156 mg/dL — ABNORMAL HIGH (ref 65–99)
Potassium: 3.9 mmol/L (ref 3.5–5.1)
Sodium: 143 mmol/L (ref 135–145)
Total Bilirubin: 0.6 mg/dL (ref 0.3–1.2)
Total Protein: 7.6 g/dL (ref 6.5–8.1)

## 2016-12-23 LAB — CBC
HCT: 44.8 % (ref 36.0–46.0)
Hemoglobin: 14.9 g/dL (ref 12.0–15.0)
MCH: 30 pg (ref 26.0–34.0)
MCHC: 33.3 g/dL (ref 30.0–36.0)
MCV: 90.3 fL (ref 78.0–100.0)
Platelets: 275 10*3/uL (ref 150–400)
RBC: 4.96 MIL/uL (ref 3.87–5.11)
RDW: 13.5 % (ref 11.5–15.5)
WBC: 14.2 10*3/uL — ABNORMAL HIGH (ref 4.0–10.5)

## 2016-12-23 LAB — URINALYSIS, ROUTINE W REFLEX MICROSCOPIC
Bacteria, UA: NONE SEEN
Bilirubin Urine: NEGATIVE
Glucose, UA: NEGATIVE mg/dL
Ketones, ur: 20 mg/dL — AB
Leukocytes, UA: NEGATIVE
Nitrite: NEGATIVE
Protein, ur: 30 mg/dL — AB
Specific Gravity, Urine: 1.025 (ref 1.005–1.030)
pH: 5 (ref 5.0–8.0)

## 2016-12-23 LAB — POC MICROSCOPIC URINALYSIS (UMFC)

## 2016-12-23 LAB — POCT CBC
Granulocyte percent: 92.7 %G — AB (ref 37–80)
HCT, POC: 43.5 % (ref 37.7–47.9)
Hemoglobin: 15.4 g/dL (ref 12.2–16.2)
Lymph, poc: 0.7 (ref 0.6–3.4)
MCH, POC: 30.3 pg (ref 27–31.2)
MCHC: 35.5 g/dL — AB (ref 31.8–35.4)
MCV: 85.5 fL (ref 80–97)
MID (cbc): 0.2 (ref 0–0.9)
MPV: 7.7 fL (ref 0–99.8)
POC Granulocyte: 11.7 — AB (ref 2–6.9)
POC LYMPH PERCENT: 5.6 %L — AB (ref 10–50)
POC MID %: 1.7 %M (ref 0–12)
Platelet Count, POC: 335 10*3/uL (ref 142–424)
RBC: 5.08 M/uL (ref 4.04–5.48)
RDW, POC: 13.4 %
WBC: 12.6 10*3/uL — AB (ref 4.6–10.2)

## 2016-12-23 LAB — CMP14+EGFR
A/G RATIO: 1.7 (ref 1.2–2.2)
ALBUMIN: 4.8 g/dL (ref 3.6–4.8)
ALT: 20 IU/L (ref 0–32)
AST: 21 IU/L (ref 0–40)
Alkaline Phosphatase: 69 IU/L (ref 39–117)
BUN / CREAT RATIO: 18 (ref 12–28)
BUN: 14 mg/dL (ref 8–27)
Bilirubin Total: 0.4 mg/dL (ref 0.0–1.2)
CALCIUM: 9.8 mg/dL (ref 8.7–10.3)
CHLORIDE: 104 mmol/L (ref 96–106)
CO2: 25 mmol/L (ref 20–29)
Creatinine, Ser: 0.78 mg/dL (ref 0.57–1.00)
GFR, EST AFRICAN AMERICAN: 94 mL/min/{1.73_m2} (ref >59)
GFR, EST NON AFRICAN AMERICAN: 81 mL/min/{1.73_m2} (ref >59)
Globulin, Total: 2.8 g/dL (ref 1.5–4.5)
Glucose: 181 mg/dL — ABNORMAL HIGH (ref 65–99)
Potassium: 4.7 mmol/L (ref 3.5–5.2)
Sodium: 146 mmol/L — ABNORMAL HIGH (ref 134–144)
TOTAL PROTEIN: 7.6 g/dL (ref 6.0–8.5)

## 2016-12-23 LAB — POCT URINALYSIS DIP (MANUAL ENTRY)
Glucose, UA: NEGATIVE mg/dL
LEUKOCYTES UA: NEGATIVE
Nitrite, UA: NEGATIVE
PH UA: 6 (ref 5.0–8.0)
UROBILINOGEN UA: 0.2 U/dL

## 2016-12-23 LAB — GLUCOSE, POCT (MANUAL RESULT ENTRY): POC Glucose: 176 mg/dl — AB (ref 70–99)

## 2016-12-23 LAB — LIPASE, BLOOD: Lipase: 667 U/L — ABNORMAL HIGH (ref 11–51)

## 2016-12-23 LAB — AMYLASE: Amylase: 612 U/L (ref 31–124)

## 2016-12-23 LAB — LIPASE: Lipase: 1391 U/L — ABNORMAL HIGH (ref 14–72)

## 2016-12-23 MED ORDER — GI COCKTAIL ~~LOC~~
30.0000 mL | Freq: Once | ORAL | Status: AC
  Administered 2016-12-23: 30 mL via ORAL

## 2016-12-23 MED ORDER — PANTOPRAZOLE SODIUM 40 MG IV SOLR
40.0000 mg | Freq: Once | INTRAVENOUS | Status: AC
  Administered 2016-12-23: 40 mg via INTRAVENOUS
  Filled 2016-12-23: qty 40

## 2016-12-23 MED ORDER — SUCRALFATE 1 G PO TABS: 1.0000 g | ORAL_TABLET | Freq: Three times a day (TID) | ORAL | 0 refills | Status: DC

## 2016-12-23 MED ORDER — DIPHENHYDRAMINE HCL 50 MG/ML IJ SOLN
25.0000 mg | Freq: Once | INTRAMUSCULAR | Status: AC
  Administered 2016-12-23: 25 mg via INTRAVENOUS
  Filled 2016-12-23: qty 1

## 2016-12-23 MED ORDER — ONDANSETRON 4 MG PO TBDP
8.0000 mg | ORAL_TABLET | Freq: Once | ORAL | Status: AC
  Administered 2016-12-23: 8 mg via ORAL

## 2016-12-23 MED ORDER — SODIUM CHLORIDE 0.9 % IV BOLUS (SEPSIS)
1000.0000 mL | Freq: Once | INTRAVENOUS | Status: AC
  Administered 2016-12-23: 1000 mL via INTRAVENOUS

## 2016-12-23 MED ORDER — ONDANSETRON 4 MG PO TBDP: 4.0000 mg | ORAL_TABLET | Freq: Three times a day (TID) | ORAL | 0 refills | Status: DC | PRN

## 2016-12-23 MED ORDER — DEXTROSE 5 % IV SOLN
2.0000 g | Freq: Once | INTRAVENOUS | Status: AC
  Administered 2016-12-23: 2 g via INTRAVENOUS
  Filled 2016-12-23: qty 2

## 2016-12-23 MED ORDER — PROMETHAZINE HCL 25 MG/ML IJ SOLN
12.5000 mg | Freq: Once | INTRAMUSCULAR | Status: AC
  Administered 2016-12-23: 12.5 mg via INTRAVENOUS
  Filled 2016-12-23 (×2): qty 1

## 2016-12-23 MED ORDER — RANITIDINE HCL 150 MG PO TABS: 150.0000 mg | ORAL_TABLET | Freq: Two times a day (BID) | ORAL | 0 refills | Status: DC

## 2016-12-23 NOTE — Telephone Encounter (Signed)
Pt has been vomitting all night and morning and doesn't feel she can ride and will vomit in the car and would like some one to call her   Best number [phone]

## 2016-12-23 NOTE — Telephone Encounter (Signed)
Critical value of lipase 1391  Pt in the ER.

## 2016-12-23 NOTE — Telephone Encounter (Signed)
[facility] ED charge nurse, Coralyn Mark, and reported that stat lipase and amylase have resulted. Lipase elevated at 1,391 and amylase elevated at 612. Coralyn Mark states that the pt has already been triaged and is waiting for a room.   Attempted to call pt but no answer.

## 2016-12-23 NOTE — ED Triage Notes (Signed)
Pt seen at [facility] today for RUQ epigastric abdominal pain, nausea, emesis, coffee ground emesis, dehydration, chills, diaphoresis, fatigue, dizziness onset yesterday at 1800. PCP evaluation revealed elevated WBCs of 12.6, conspicuous gaseous distension of right colon.

## 2016-12-23 NOTE — ED Notes (Signed)
Computer error by new Management consultant member. Pt has been waiting 1 hour longer than represented by timer.

## 2016-12-23 NOTE — ED Notes (Signed)
Neither [facility] manager nor I were able to get an IV in room 19.  Will attempt [place].

## 2016-12-23 NOTE — Progress Notes (Signed)
Kimberly Brown  MRN: [id number] DOB: 1950/07/19  Subjective:   Kimberly Brown is a 66 y.o. female who presents for evaluation of abdominal pain. Onset was 1 day ago. Occurred last night right after dinner. She had pasta and spinach for dinner. Symptoms have been gradually improving. The pain is described as dull, and is 5/10 in intensity. Pain is located in the epigastric region without radiation.  Aggravating factors: activity.  Alleviating factors: resting. Associated symptoms: anorexia, chills, frequency, nausea, sweats and vomiting. The patient denies hematemesis, constipation, diarrhea, dysuria, headache, hematochezia, hematuria, melena and myalgias. She has not been able to keep fluids or food down. She had a normal bowel movement yesterday. Denies recent NSAID use in the past 4 weeks. Prior to this she took 6 ibuprofen daily for 2 months. Denies smoking and alcohol use.   She was seen by NP Harris in 06/2016 for LLQ pain and nausea. CT of abdomen showed epiploic appendagitis with fat necrosis. She was referred to General Surgery for further evaluation. She did not follow up with this referral. She has had RUQ pain after eating with associated nausea x 1 month ago.   No PMH of ulcers or GERD. She has never had a colonoscopy. PSH of abdominal hernia repair and hysterectomy. FH of rectal cancer in mother.   Review of Systems  Respiratory: Negative for cough and shortness of breath.   Cardiovascular: Negative for chest pain, palpitations and leg swelling.      Patient Active Problem List   Diagnosis Date Noted  . Dizziness 09/28/2016  . Former smoker 09/28/2016    Current Outpatient Prescriptions on File Prior to Visit  Medication Sig Dispense Refill  . blood glucose meter kit and supplies KIT Dispense based on patient and insurance preference. Use up to four times daily as directed. (FOR ICD-10 E11.9). 1 each 0   No current facility-administered medications on file prior to  visit.     No Known Allergies   Objective:  BP (!) 144/76 (BP Location: Right Arm, Patient Position: Sitting, Cuff Size: Normal)   Pulse 74   Temp 98.9 F (37.2 C) (Oral)   Resp 18   Ht 5' 3.54" (1.614 m)   Wt 166 lb 6.4 oz (75.5 kg)   SpO2 97%   BMI 28.97 kg/m   Physical Exam  Constitutional: She is oriented to person, place, and time.  Well developed, well nourished, appears distressed lying on exam table.   HENT:  Head: Normocephalic and atraumatic.  Mouth/Throat: Uvula is midline. Mucous membranes are dry.  Eyes: Conjunctivae are normal.  Neck: Normal range of motion.  Cardiovascular: Normal rate, regular rhythm and normal heart sounds.   Pulmonary/Chest: Effort normal.  Abdominal: Soft. Normal appearance. She exhibits no distension. Bowel sounds are hypoactive. There is generalized tenderness (generalized tenderness but most notable in epigastric region, pt voluntarily guards with palpation of epigastric region) and tenderness in the epigastric area. There is negative Murphy's sign.  Neurological: She is alert and oriented to person, place, and time. Gait normal.  Skin: Skin is warm and dry.  Psychiatric: Affect normal.  Vitals reviewed.  Dg Abd 2 Views  Result Date: 12/23/2016 CLINICAL DATA:  67 year old female with abdominal pain distention nausea and vomiting for 1 day. EXAM: ABDOMEN - 2 VIEW COMPARISON:  CT Abdomen and Pelvis 06/21/2016 and earlier. FINDINGS: Upright and supine views of the abdomen and pelvis. Negative lung bases. No pneumoperitoneum. Retained stool in the colon. There is mild to moderate  gaseous distention of the ascending colon and hepatic flexure, with a paucity of bowel gas elsewhere. Still, there is no dilated small bowel. No right colon abnormality was identified at the time of the CT in December. Evidence of decompressed small bowel loops in the left and right lower abdomen. There is some gas in the descending colon and sigmoid. No acute osseous  abnormality identified. Other abdominal and pelvic visceral contours are within normal limits. IMPRESSION: Conspicuous gaseous distension of the right colon, but no other evidence of acute bowel obstruction and no free air. There is retained stool in the more distal colon. Electronically Signed   By: Genevie Ann M.D.   On: 12/23/2016 15:12   Results for orders placed or performed in visit on 12/23/16 (from the past 24 hour(s))  POCT glucose (manual entry)     Status: Abnormal   Collection Time: 12/23/16  3:42 PM  Result Value Ref Range   POC Glucose 176 (A) 70 - 99 mg/dl  POCT CBC     Status: Abnormal   Collection Time: 12/23/16  3:43 PM  Result Value Ref Range   WBC 12.6 (A) 4.6 - 10.2 K/uL   Lymph, poc 0.7 0.6 - 3.4   POC LYMPH PERCENT 5.6 (A) 10 - 50 %L   MID (cbc) 0.2 0 - 0.9   POC MID % 1.7 0 - 12 %M   POC Granulocyte 11.7 (A) 2 - 6.9   Granulocyte percent 92.7 (A) 37 - 80 %G   RBC 5.08 4.04 - 5.48 M/uL   Hemoglobin 15.4 12.2 - 16.2 g/dL   HCT, POC 43.5 37.7 - 47.9 %   MCV 85.5 80 - 97 fL   MCH, POC 30.3 27 - 31.2 pg   MCHC 35.5 (A) 31.8 - 35.4 g/dL   RDW, POC 13.4 %   Platelet Count, POC 335 142 - 424 K/uL   MPV 7.7 0 - 99.8 fL  POCT urinalysis dipstick     Status: Abnormal   Collection Time: 12/23/16  5:00 PM  Result Value Ref Range   Color, UA yellow yellow   Clarity, UA clear clear   Glucose, UA negative negative mg/dL   Bilirubin, UA small (A) negative   Ketones, POC UA large (80) (A) negative mg/dL   Spec Grav, UA >=1.030 (A) 1.010 - 1.025   Blood, UA moderate (A) negative   pH, UA 6.0 5.0 - 8.0   Protein Ur, POC =100 (A) negative mg/dL   Urobilinogen, UA 0.2 0.2 or 1.0 E.U./dL   Nitrite, UA Negative Negative   Leukocytes, UA Negative Negative  POCT Microscopic Urinalysis ([facility])     Status: Abnormal   Collection Time: 12/23/16  5:08 PM  Result Value Ref Range   WBC,UR,HPF,POC None None WBC/hpf   RBC,UR,HPF,POC Few (A) None RBC/hpf   Bacteria None None, Too  numerous to count   Mucus Present (A) Absent   Epithelial Cells, UR Per Microscopy Many (A) None, Too numerous to count cells/hpf    Post admin of 1L IVF, GI cocktail, and zofran pt's epigastric tenderness improved however she is now exquisitely tender in the RUQ, with positive Murphy's sign.   Assessment and Plan :   1. Generalized abdominal pain - DG Abd 2 Views; Future 2. Acute epigastric pain - POCT urinalysis dipstick - POCT Microscopic Urinalysis ([facility]) - POCT CBC - POCT glucose (manual entry) - gi cocktail (Maalox,Lidocaine,Donnatal); Take 30 mLs by mouth once. - Lipase - Amylase - CMP14+EGFR - Lipase  3. Nausea and vomiting, intractability of vomiting not specified, unspecified vomiting type - ondansetron (ZOFRAN-ODT) disintegrating tablet 8 mg; Take 2 tablets (8 mg total) by mouth once. 4. Positive Murphy's Sign Due to hx, PE findings, elevated WBC of 12.6, dehydration, and positive Murphy's sign on exam post admin of 1L NS IVF, pt warrants further emergent evaluation by ED as we cannot schedule outpatient STAT imaging at this time. Due to stable vital signs, she does not warrant EMS transportation. She is accompanied by her son, who agrees to transport via personal vehicle to [facility] ED.  Pt agrees to plan.   Tenna Delaine, PA-C  Primary Care at [facility] 12/23/2016 7:10 PM

## 2016-12-23 NOTE — ED Provider Notes (Signed)
[facility] DEPT Provider Note   CSN: [id number] Arrival date & time: 12/23/16  1802     History   Chief Complaint Chief Complaint  Patient presents with  . Abdominal Pain    HPI Kimberly Brown is a 66 y.o. female.  HPI   45yF with abdominal pain and n/v. Onset last night. Persistent since. Pain in upper abdomen. Multiple episodes of vomiting. Coffee grounds initially then bilious. Seen at [facility] and then referred to ED. Lipase was >1,000. Subjective fever. No diarrhea. No urinary complaints. Says she hasn't had an alcoholic drink in over 20 years. Was taking ibuprofen daily for orthopedic issue several months none in past month. Denies known GB issues.   Past Medical History:  Diagnosis Date  . Diabetes mellitus without complication ([place])   . Hernia of abdominal cavity     Patient Active Problem List   Diagnosis Date Noted  . Dizziness 09/28/2016  . Former smoker 09/28/2016    Past Surgical History:  Procedure Laterality Date  . ABDOMINAL HYSTERECTOMY    . BASAL CELL CARCINOMA EXCISION      OB History    No data available       Home Medications    Prior to Admission medications   Medication Sig Start Date End Date Taking? Authorizing Provider  glucosamine-chondroitin 500-400 MG tablet Take 1 tablet by mouth 3 (three) times daily.   Yes [provider]  blood glucose meter kit and supplies KIT Dispense based on patient and insurance preference. Use up to four times daily as directed. (FOR ICD-10 E11.9). 12/08/16   Shawnee Knapp, MD    Family History Family History  Problem Relation Age of Onset  . Kidney failure Sister   . Cancer Mother 68       rectal  . Stroke Neg Hx     Social History Social History  Substance Use Topics  . Smoking status: Never Smoker  . Smokeless tobacco: Never Used  . Alcohol use No     Allergies   Patient has no known allergies.   Review of Systems Review of Systems  All systems reviewed and negative, other  than as noted in HPI.  Physical Exam Updated Vital Signs BP (!) 154/77 (BP Location: Left Arm)   Pulse 69   Temp 98.6 F (37 C) (Oral)   Resp 18   SpO2 98%   Physical Exam  Constitutional: She appears well-developed and well-nourished. No distress.  HENT:  Head: Normocephalic and atraumatic.  Eyes: Conjunctivae are normal. Right eye exhibits no discharge. Left eye exhibits no discharge.  Neck: Neck supple.  Cardiovascular: Normal rate, regular rhythm and normal heart sounds.  Exam reveals no gallop and no friction rub.   No murmur heard. Pulmonary/Chest: Effort normal and breath sounds normal. No respiratory distress.  Abdominal: Soft. She exhibits no distension. There is tenderness.  Musculoskeletal: She exhibits no edema or tenderness.  Neurological: She is alert.  Skin: Skin is warm and dry.  Psychiatric: She has a normal mood and affect. Her behavior is normal. Thought content normal.  Nursing note and vitals reviewed.    ED Treatments / Results  Labs (all labs ordered are listed, but only abnormal results are displayed) Labs Reviewed  LIPASE, BLOOD - Abnormal; Notable for the following:       Result Value   Lipase 667 (*)    All other components within normal limits  COMPREHENSIVE METABOLIC PANEL - Abnormal; Notable for the following:  Glucose, Bld 156 (*)    All other components within normal limits  CBC - Abnormal; Notable for the following:    WBC 14.2 (*)    All other components within normal limits  URINALYSIS, ROUTINE W REFLEX MICROSCOPIC - Abnormal; Notable for the following:    Hgb urine dipstick SMALL (*)    Ketones, ur 20 (*)    Protein, ur 30 (*)    Squamous Epithelial / LPF 0-5 (*)    All other components within normal limits    EKG  EKG Interpretation None       Radiology Dg Abd 2 Views  Result Date: 12/23/2016 CLINICAL DATA:  66 year old female with abdominal pain distention nausea and vomiting for 1 day. EXAM: ABDOMEN - 2 VIEW  COMPARISON:  CT Abdomen and Pelvis 06/21/2016 and earlier. FINDINGS: Upright and supine views of the abdomen and pelvis. Negative lung bases. No pneumoperitoneum. Retained stool in the colon. There is mild to moderate gaseous distention of the ascending colon and hepatic flexure, with a paucity of bowel gas elsewhere. Still, there is no dilated small bowel. No right colon abnormality was identified at the time of the CT in December. Evidence of decompressed small bowel loops in the left and right lower abdomen. There is some gas in the descending colon and sigmoid. No acute osseous abnormality identified. Other abdominal and pelvic visceral contours are within normal limits. IMPRESSION: Conspicuous gaseous distension of the right colon, but no other evidence of acute bowel obstruction and no free air. There is retained stool in the more distal colon. Electronically Signed   By: Genevie Ann M.D.   On: 12/23/2016 15:12    Procedures Procedures (including critical care time)  Medications Ordered in ED Medications  sodium chloride 0.9 % bolus 1,000 mL (not administered)  promethazine (PHENERGAN) injection 12.5 mg (not administered)  pantoprazole (PROTONIX) injection 40 mg (not administered)  cefTRIAXone (ROCEPHIN) 2 g in dextrose 5 % 50 mL IVPB (not administered)     Initial Impression / Assessment and Plan / ED Course  I have reviewed the triage vital signs and the nursing notes.  Pertinent labs & imaging results that were available during my care of the patient were reviewed by me and considered in my medical decision making (see chart for details).     65yF with abdominal pain and n/v. Lipase elevated. [place] with cholelithiasis and GB thickening. No pericholecystic fluid. Subjective fever but afebrile in the ED. Leukocytosis. With normal LFTs and CBD, surgery initial consult as opossed to GU. Abx. IVF/antiemetics. Pt extremely reluctant to take pain medication despite reassurance.    Final Clinical  Impressions(s) / ED Diagnoses   Final diagnoses:  Gallstone pancreatitis    New Prescriptions New Prescriptions   No medications on file     Virgel Manifold, MD 12/24/16 [phone]

## 2016-12-23 NOTE — Patient Instructions (Addendum)
I recommend you go directly to the ED for further evaluation due to your nausea, vomiting, dehydration, exquisite right upper quadrant and epigastric pain, with an elevated white count of 12.6.  Your xray in our office showed conspicuous gaseous distension of the right colon and I believe you would benefit from further evaluation and imaging. Thank you for letting me participate in your health and well being.    IF you received an x-ray today, you will receive an invoice from [facility]. Please contact [facility] at [phone] with questions or concerns regarding your invoice.   IF you received labwork today, you will receive an invoice from [facility]. Please contact [facility] at [phone] with questions or concerns regarding your invoice.   Our billing staff will not be able to assist you with questions regarding bills from these companies.  You will be contacted with the lab results as soon as they are available. The fastest way to get your results is to activate your My Chart account. Instructions are located on the last page of this paperwork. If you have not heard from [place] regarding the results in 2 weeks, please contact this office.       We recommend that you schedule a mammogram for breast cancer screening. Typically, you do not need a referral to do this. Please contact a local imaging center to schedule your mammogram.  [facility] - [phone]  *ask for the Radiology Department [facility] ([place]) - [phone] or [phone]  [facility] - [phone] [place] [phone] [facility] - [phone]  *ask for the [facility] - [phone]  *ask for the Radiology Department [facility] - [phone]  *ask for the [place] - [phone]

## 2016-12-23 NOTE — ED Notes (Signed)
Attempt x 2 unsuccessful for PIV

## 2016-12-24 ENCOUNTER — Inpatient Hospital Stay (HOSPITAL_COMMUNITY): Payer: BLUE CROSS/BLUE SHIELD

## 2016-12-24 ENCOUNTER — Other Ambulatory Visit: Payer: Self-pay | Admitting: General Surgery

## 2016-12-24 ENCOUNTER — Encounter (HOSPITAL_COMMUNITY): Payer: Self-pay | Admitting: Internal Medicine

## 2016-12-24 DIAGNOSIS — K769 Liver disease, unspecified: Secondary | ICD-10-CM | POA: Diagnosis present

## 2016-12-24 DIAGNOSIS — E785 Hyperlipidemia, unspecified: Secondary | ICD-10-CM

## 2016-12-24 DIAGNOSIS — R112 Nausea with vomiting, unspecified: Secondary | ICD-10-CM | POA: Diagnosis not present

## 2016-12-24 DIAGNOSIS — E1169 Type 2 diabetes mellitus with other specified complication: Secondary | ICD-10-CM | POA: Diagnosis present

## 2016-12-24 DIAGNOSIS — R9431 Abnormal electrocardiogram [ECG] [EKG]: Secondary | ICD-10-CM | POA: Diagnosis present

## 2016-12-24 DIAGNOSIS — Z87891 Personal history of nicotine dependence: Secondary | ICD-10-CM | POA: Diagnosis not present

## 2016-12-24 DIAGNOSIS — K859 Acute pancreatitis without necrosis or infection, unspecified: Secondary | ICD-10-CM | POA: Diagnosis not present

## 2016-12-24 DIAGNOSIS — K810 Acute cholecystitis: Secondary | ICD-10-CM | POA: Diagnosis present

## 2016-12-24 DIAGNOSIS — Z85828 Personal history of other malignant neoplasm of skin: Secondary | ICD-10-CM | POA: Diagnosis not present

## 2016-12-24 DIAGNOSIS — K8020 Calculus of gallbladder without cholecystitis without obstruction: Secondary | ICD-10-CM | POA: Diagnosis not present

## 2016-12-24 DIAGNOSIS — E119 Type 2 diabetes mellitus without complications: Secondary | ICD-10-CM

## 2016-12-24 DIAGNOSIS — Z90710 Acquired absence of both cervix and uterus: Secondary | ICD-10-CM | POA: Diagnosis not present

## 2016-12-24 DIAGNOSIS — K851 Biliary acute pancreatitis without necrosis or infection: Principal | ICD-10-CM

## 2016-12-24 DIAGNOSIS — Z79899 Other long term (current) drug therapy: Secondary | ICD-10-CM | POA: Diagnosis not present

## 2016-12-24 DIAGNOSIS — E876 Hypokalemia: Secondary | ICD-10-CM | POA: Diagnosis present

## 2016-12-24 LAB — COMPREHENSIVE METABOLIC PANEL
ALBUMIN: 3.6 g/dL (ref 3.5–5.0)
ALT: 16 U/L (ref 14–54)
AST: 13 U/L — AB (ref 15–41)
Alkaline Phosphatase: 45 U/L (ref 38–126)
Anion gap: 8 (ref 5–15)
BILIRUBIN TOTAL: 0.7 mg/dL (ref 0.3–1.2)
BUN: 15 mg/dL (ref 6–20)
CO2: 24 mmol/L (ref 22–32)
CREATININE: 0.6 mg/dL (ref 0.44–1.00)
Calcium: 8.3 mg/dL — ABNORMAL LOW (ref 8.9–10.3)
Chloride: 111 mmol/L (ref 101–111)
GFR calc Af Amer: >60 mL/min (ref >60)
GFR calc non Af Amer: >60 mL/min (ref >60)
GLUCOSE: 164 mg/dL — AB (ref 65–99)
Potassium: 3.6 mmol/L (ref 3.5–5.1)
Sodium: 143 mmol/L (ref 135–145)
TOTAL PROTEIN: 6.3 g/dL — AB (ref 6.5–8.1)

## 2016-12-24 LAB — HIV ANTIBODY (ROUTINE TESTING W REFLEX): HIV Screen 4th Generation wRfx: NONREACTIVE

## 2016-12-24 LAB — GLUCOSE, CAPILLARY
GLUCOSE-CAPILLARY: 145 mg/dL — AB (ref 65–99)
GLUCOSE-CAPILLARY: 143 mg/dL — AB (ref 65–99)
GLUCOSE-CAPILLARY: 133 mg/dL — AB (ref 65–99)
Glucose-Capillary: 135 mg/dL — ABNORMAL HIGH (ref 65–99)
Glucose-Capillary: 119 mg/dL — ABNORMAL HIGH (ref 65–99)

## 2016-12-24 LAB — MAGNESIUM: Magnesium: 1.8 mg/dL (ref 1.7–2.4)

## 2016-12-24 LAB — CBC
HEMATOCRIT: 38.3 % (ref 36.0–46.0)
Hemoglobin: 12.3 g/dL (ref 12.0–15.0)
MCH: 29.1 pg (ref 26.0–34.0)
MCHC: 32.1 g/dL (ref 30.0–36.0)
MCV: 90.5 fL (ref 78.0–100.0)
Platelets: 293 10*3/uL (ref 150–400)
RBC: 4.23 MIL/uL (ref 3.87–5.11)
RDW: 13.8 % (ref 11.5–15.5)
WBC: 13.2 10*3/uL — ABNORMAL HIGH (ref 4.0–10.5)

## 2016-12-24 LAB — LACTIC ACID, PLASMA: LACTIC ACID, VENOUS: 1.3 mmol/L (ref 0.5–1.9)

## 2016-12-24 LAB — LIPASE, BLOOD: LIPASE: 190 U/L — AB (ref 11–51)

## 2016-12-24 LAB — PHOSPHORUS: PHOSPHORUS: 3.4 mg/dL (ref 2.5–4.6)

## 2016-12-24 LAB — TSH: TSH: 0.364 u[IU]/mL (ref 0.350–4.500)

## 2016-12-24 MED ORDER — PIPERACILLIN-TAZOBACTAM 3.375 G IVPB 30 MIN
3.3750 g | Freq: Once | INTRAVENOUS | Status: AC
  Administered 2016-12-24: 3.375 g via INTRAVENOUS
  Filled 2016-12-24: qty 50

## 2016-12-24 MED ORDER — ONDANSETRON HCL 4 MG PO TABS: 4.0000 mg | ORAL_TABLET | Freq: Four times a day (QID) | ORAL | Status: DC | PRN

## 2016-12-24 MED ORDER — IOPAMIDOL (ISOVUE-300) INJECTION 61%
100.0000 mL | Freq: Once | INTRAVENOUS | Status: AC | PRN
  Administered 2016-12-24: 100 mL via INTRAVENOUS

## 2016-12-24 MED ORDER — ONDANSETRON HCL 4 MG/2ML IJ SOLN
4.0000 mg | Freq: Four times a day (QID) | INTRAMUSCULAR | Status: DC | PRN
  Administered 2016-12-24 – 2016-12-26 (×4): 4 mg via INTRAVENOUS
  Filled 2016-12-24 (×4): qty 2

## 2016-12-24 MED ORDER — HYDROMORPHONE HCL 1 MG/ML IJ SOLN
0.5000 mg | INTRAMUSCULAR | Status: DC | PRN
  Administered 2016-12-25 – 2016-12-26 (×2): 0.5 mg via INTRAVENOUS
  Filled 2016-12-24 (×2): qty 1

## 2016-12-24 MED ORDER — PIPERACILLIN-TAZOBACTAM 3.375 G IVPB
3.3750 g | Freq: Three times a day (TID) | INTRAVENOUS | Status: DC
  Administered 2016-12-24 – 2016-12-26 (×7): 3.375 g via INTRAVENOUS
  Filled 2016-12-24 (×7): qty 50

## 2016-12-24 MED ORDER — ENOXAPARIN SODIUM 40 MG/0.4ML ~~LOC~~ SOLN
40.0000 mg | SUBCUTANEOUS | Status: DC
  Administered 2016-12-24 – 2016-12-27 (×4): 40 mg via SUBCUTANEOUS
  Filled 2016-12-24 (×4): qty 0.4

## 2016-12-24 MED ORDER — SODIUM CHLORIDE 0.9 % IV SOLN
INTRAVENOUS | Status: AC
  Administered 2016-12-24 (×2): via INTRAVENOUS

## 2016-12-24 MED ORDER — ACETAMINOPHEN 650 MG RE SUPP: 650.0000 mg | Freq: Four times a day (QID) | RECTAL | Status: DC | PRN

## 2016-12-24 MED ORDER — IOPAMIDOL (ISOVUE-300) INJECTION 61%
INTRAVENOUS | Status: AC
  Filled 2016-12-24: qty 100

## 2016-12-24 MED ORDER — INSULIN ASPART 100 UNIT/ML ~~LOC~~ SOLN
0.0000 [IU] | SUBCUTANEOUS | Status: DC
  Administered 2016-12-24 – 2016-12-25 (×6): 1 [IU] via SUBCUTANEOUS
  Administered 2016-12-25 (×3): 2 [IU] via SUBCUTANEOUS
  Administered 2016-12-26: 1 [IU] via SUBCUTANEOUS
  Administered 2016-12-26: 2 [IU] via SUBCUTANEOUS
  Administered 2016-12-26: 1 [IU] via SUBCUTANEOUS
  Administered 2016-12-26: 2 [IU] via SUBCUTANEOUS

## 2016-12-24 MED ORDER — ACETAMINOPHEN 325 MG PO TABS: 650.0000 mg | ORAL_TABLET | Freq: Four times a day (QID) | ORAL | Status: DC | PRN

## 2016-12-24 NOTE — Progress Notes (Signed)
66 y.o. F with gallstone pancreatitis  Subjective: Pt feeling better  Objective: Vital signs in last 24 hours: Temp:  [98.4 F (36.9 C)-98.9 F (37.2 C)] 98.4 F (36.9 C) (06/16 0607) Pulse Rate:  [60-89] 89 (06/16 0607) Resp:  [12-22] 16 (06/16 0607) BP: (124-154)/(48-77) 124/48 (06/16 0607) SpO2:  [96 %-100 %] 96 % (06/16 0607) Weight:  [75.5 kg (166 lb 6.4 oz)-77.3 kg (170 lb 6.7 oz)] 77.3 kg (170 lb 6.7 oz) (06/16 0237) Last BM Date: 12/23/16  Intake/Output from previous day: 06/15 0701 - 06/16 0700 In: 1456.3 [I.V.:406.3; IV Piggyback:1050] Out: -  Intake/Output this shift: No intake/output data recorded.  General appearance: alert and cooperative GI: soft, less tender  Lab Results:  Results for orders placed or performed during the hospital encounter of 12/23/16 (from the past 24 hour(s))  Lipase, blood     Status: Abnormal   Collection Time: 12/23/16  6:28 PM  Result Value Ref Range   Lipase 667 (H) 11 - 51 U/L  Comprehensive metabolic panel     Status: Abnormal   Collection Time: 12/23/16  6:28 PM  Result Value Ref Range   Sodium 143 135 - 145 mmol/L   Potassium 3.9 3.5 - 5.1 mmol/L   Chloride 109 101 - 111 mmol/L   CO2 23 22 - 32 mmol/L   Glucose, Bld 156 (H) 65 - 99 mg/dL   BUN 14 6 - 20 mg/dL   Creatinine, Ser 0.63 0.44 - 1.00 mg/dL   Calcium 9.0 8.9 - 10.3 mg/dL   Total Protein 7.6 6.5 - 8.1 g/dL   Albumin 4.6 3.5 - 5.0 g/dL   AST 25 15 - 41 U/L   ALT 22 14 - 54 U/L   Alkaline Phosphatase 57 38 - 126 U/L   Total Bilirubin 0.6 0.3 - 1.2 mg/dL   GFR calc non Af Amer >60 >60 mL/min   GFR calc Af Amer >60 >60 mL/min   Anion gap 11 5 - 15  CBC     Status: Abnormal   Collection Time: 12/23/16  6:28 PM  Result Value Ref Range   WBC 14.2 (H) 4.0 - 10.5 K/uL   RBC 4.96 3.87 - 5.11 MIL/uL   Hemoglobin 14.9 12.0 - 15.0 g/dL   HCT 44.8 36.0 - 46.0 %   MCV 90.3 78.0 - 100.0 fL   MCH 30.0 26.0 - 34.0 pg   MCHC 33.3 30.0 - 36.0 g/dL   RDW 13.5 11.5 - 15.5  %   Platelets 275 150 - 400 K/uL  Urinalysis, Routine w reflex microscopic     Status: Abnormal   Collection Time: 12/23/16  7:38 PM  Result Value Ref Range   Color, Urine YELLOW YELLOW   APPearance CLEAR CLEAR   Specific Gravity, Urine 1.025 1.005 - 1.030   pH 5.0 5.0 - 8.0   Glucose, UA NEGATIVE NEGATIVE mg/dL   Hgb urine dipstick SMALL (A) NEGATIVE   Bilirubin Urine NEGATIVE NEGATIVE   Ketones, ur 20 (A) NEGATIVE mg/dL   Protein, ur 30 (A) NEGATIVE mg/dL   Nitrite NEGATIVE NEGATIVE   Leukocytes, UA NEGATIVE NEGATIVE   RBC / HPF 6-30 0 - 5 RBC/hpf   WBC, UA 0-5 0 - 5 WBC/hpf   Bacteria, UA NONE SEEN NONE SEEN   Squamous Epithelial / LPF 0-5 (A) NONE SEEN   Mucous PRESENT   Lactic acid, plasma     Status: None   Collection Time: 12/24/16  1:37 AM  Result Value Ref  Range   Lactic Acid, Venous 1.3 0.5 - 1.9 mmol/L  Magnesium     Status: None   Collection Time: 12/24/16  4:29 AM  Result Value Ref Range   Magnesium 1.8 1.7 - 2.4 mg/dL  Phosphorus     Status: None   Collection Time: 12/24/16  4:29 AM  Result Value Ref Range   Phosphorus 3.4 2.5 - 4.6 mg/dL  TSH     Status: None   Collection Time: 12/24/16  4:29 AM  Result Value Ref Range   TSH 0.364 0.350 - 4.500 uIU/mL  Comprehensive metabolic panel     Status: Abnormal   Collection Time: 12/24/16  4:29 AM  Result Value Ref Range   Sodium 143 135 - 145 mmol/L   Potassium 3.6 3.5 - 5.1 mmol/L   Chloride 111 101 - 111 mmol/L   CO2 24 22 - 32 mmol/L   Glucose, Bld 164 (H) 65 - 99 mg/dL   BUN 15 6 - 20 mg/dL   Creatinine, Ser 0.60 0.44 - 1.00 mg/dL   Calcium 8.3 (L) 8.9 - 10.3 mg/dL   Total Protein 6.3 (L) 6.5 - 8.1 g/dL   Albumin 3.6 3.5 - 5.0 g/dL   AST 13 (L) 15 - 41 U/L   ALT 16 14 - 54 U/L   Alkaline Phosphatase 45 38 - 126 U/L   Total Bilirubin 0.7 0.3 - 1.2 mg/dL   GFR calc non Af Amer >60 >60 mL/min   GFR calc Af Amer >60 >60 mL/min   Anion gap 8 5 - 15  CBC     Status: Abnormal   Collection Time: 12/24/16   4:29 AM  Result Value Ref Range   WBC 13.2 (H) 4.0 - 10.5 K/uL   RBC 4.23 3.87 - 5.11 MIL/uL   Hemoglobin 12.3 12.0 - 15.0 g/dL   HCT 38.3 36.0 - 46.0 %   MCV 90.5 78.0 - 100.0 fL   MCH 29.1 26.0 - 34.0 pg   MCHC 32.1 30.0 - 36.0 g/dL   RDW 13.8 11.5 - 15.5 %   Platelets 293 150 - 400 K/uL  Lipase, blood     Status: Abnormal   Collection Time: 12/24/16  4:29 AM  Result Value Ref Range   Lipase 190 (H) 11 - 51 U/L  Glucose, capillary     Status: Abnormal   Collection Time: 12/24/16  5:20 AM  Result Value Ref Range   Glucose-Capillary 145 (H) 65 - 99 mg/dL  Glucose, capillary     Status: Abnormal   Collection Time: 12/24/16  7:53 AM  Result Value Ref Range   Glucose-Capillary 135 (H) 65 - 99 mg/dL   Comment 1 Document in Chart      Studies/Results Radiology     MEDS, Scheduled . insulin aspart  0-9 Units Subcutaneous Q4H  . iopamidol         Assessment: Gallstone pancreatitis.  No biliary obstruction.  Lipase and Wbc trending down   Plan:  Recheck labs in AM.  IF still trending down, will plan on lap chole  Can try some clears today. COnt IV abx    LOS: 0 days    Rosario Adie, MD [facility], [place]   12/24/2016 8:56 AM

## 2016-12-24 NOTE — ED Notes (Signed)
Unable to get second set of BC's d/t pt's poor IV access outside of single [place] IV line in [place].  First set was ordered and drawn after first abx given and before second given.

## 2016-12-24 NOTE — Consult Note (Signed)
Reason for Consult:Gallstone pancreatitis Referring Physician: Dr Robb Matar Melchior is an 66 y.o. female.  HPI: 66 y.o. F with ~24 hrs of mid-epigastric pain, nausea and vomiting.  She was seen at urgent care due to inability to tolerate PO.  She has had this once before a few months ago but it resolved on its own.  Lipase at urgent care was 1300.  Past Medical History:  Diagnosis Date  . Diabetes mellitus without complication ([place])   . Hernia of abdominal cavity     Past Surgical History:  Procedure Laterality Date  . ABDOMINAL HYSTERECTOMY    . BASAL CELL CARCINOMA EXCISION      Family History  Problem Relation Age of Onset  . Kidney failure Sister   . Cancer Mother 9       rectal  . Stroke Neg Hx     Social History:  reports that she has quit smoking. She has never used smokeless tobacco. She reports that she does not drink alcohol or use drugs.  Allergies: No Known Allergies  Medications: I have reviewed the patient's current medications.  Results for orders placed or performed during the hospital encounter of 12/23/16 (from the past 48 hour(s))  Lipase, blood     Status: Abnormal   Collection Time: 12/23/16  6:28 PM  Result Value Ref Range   Lipase 667 (H) 11 - 51 U/L    Comment: RESULTS CONFIRMED BY MANUAL DILUTION  Comprehensive metabolic panel     Status: Abnormal   Collection Time: 12/23/16  6:28 PM  Result Value Ref Range   Sodium 143 135 - 145 mmol/L   Potassium 3.9 3.5 - 5.1 mmol/L   Chloride 109 101 - 111 mmol/L   CO2 23 22 - 32 mmol/L   Glucose, Bld 156 (H) 65 - 99 mg/dL   BUN 14 6 - 20 mg/dL   Creatinine, Ser 0.63 0.44 - 1.00 mg/dL   Calcium 9.0 8.9 - 10.3 mg/dL   Total Protein 7.6 6.5 - 8.1 g/dL   Albumin 4.6 3.5 - 5.0 g/dL   AST 25 15 - 41 U/L   ALT 22 14 - 54 U/L   Alkaline Phosphatase 57 38 - 126 U/L   Total Bilirubin 0.6 0.3 - 1.2 mg/dL   GFR calc non Af Amer >60 >60 mL/min   GFR calc Af Amer >60 >60 mL/min    Comment: (NOTE) The  eGFR has been calculated using the CKD EPI equation. This calculation has not been validated in all clinical situations. eGFR's persistently <60 mL/min signify possible Chronic Kidney Disease.    Anion gap 11 5 - 15  CBC     Status: Abnormal   Collection Time: 12/23/16  6:28 PM  Result Value Ref Range   WBC 14.2 (H) 4.0 - 10.5 K/uL   RBC 4.96 3.87 - 5.11 MIL/uL   Hemoglobin 14.9 12.0 - 15.0 g/dL   HCT 44.8 36.0 - 46.0 %   MCV 90.3 78.0 - 100.0 fL   MCH 30.0 26.0 - 34.0 pg   MCHC 33.3 30.0 - 36.0 g/dL   RDW 13.5 11.5 - 15.5 %   Platelets 275 150 - 400 K/uL  Urinalysis, Routine w reflex microscopic     Status: Abnormal   Collection Time: 12/23/16  7:38 PM  Result Value Ref Range   Color, Urine YELLOW YELLOW   APPearance CLEAR CLEAR   Specific Gravity, Urine 1.025 1.005 - 1.030   pH 5.0 5.0 - 8.0  Glucose, UA NEGATIVE NEGATIVE mg/dL   Hgb urine dipstick SMALL (A) NEGATIVE   Bilirubin Urine NEGATIVE NEGATIVE   Ketones, ur 20 (A) NEGATIVE mg/dL   Protein, ur 30 (A) NEGATIVE mg/dL   Nitrite NEGATIVE NEGATIVE   Leukocytes, UA NEGATIVE NEGATIVE   RBC / HPF 6-30 0 - 5 RBC/hpf   WBC, UA 0-5 0 - 5 WBC/hpf   Bacteria, UA NONE SEEN NONE SEEN   Squamous Epithelial / LPF 0-5 (A) NONE SEEN   Mucous PRESENT     US Abdomen Limited  Result Date: 12/23/2016 CLINICAL DATA:  Upper abdominal pain and increased lipase. Pancreatitis. EXAM: ULTRASOUND ABDOMEN LIMITED RIGHT UPPER QUADRANT COMPARISON:  None. FINDINGS: Gallbladder: The gallbladder is normally distended. There is marked edema of the gallbladder wall measuring up to 7 mm. Echogenic foci within the gallbladder may represent gallbladder stones/sludge. The largest gallstone measures 8 mm. Sonographic Murphy's sign was reported as negative. Common bile duct: Diameter: 4.7 mm in maximum transverse dimension. Liver: No focal lesion identified. Within normal limits in parenchymal echogenicity. IMPRESSION: Marked gallbladder wall edema with  thickening of up to 7 mm, findings usually associated with acute cholecystitis. Cholelithiasis. Sonographic Murphy's sign was reported as negative. Was the patient given pain medications prior to her ultrasound exam? Electronically Signed   By: Fidela Salisbury M.D.   On: 12/23/2016 21:43   Dg Abd 2 Views  Result Date: 12/23/2016 CLINICAL DATA:  66 year old female with abdominal pain distention nausea and vomiting for 1 day. EXAM: ABDOMEN - 2 VIEW COMPARISON:  CT Abdomen and Pelvis 06/21/2016 and earlier. FINDINGS: Upright and supine views of the abdomen and pelvis. Negative lung bases. No pneumoperitoneum. Retained stool in the colon. There is mild to moderate gaseous distention of the ascending colon and hepatic flexure, with a paucity of bowel gas elsewhere. Still, there is no dilated small bowel. No right colon abnormality was identified at the time of the CT in December. Evidence of decompressed small bowel loops in the left and right lower abdomen. There is some gas in the descending colon and sigmoid. No acute osseous abnormality identified. Other abdominal and pelvic visceral contours are within normal limits. IMPRESSION: Conspicuous gaseous distension of the right colon, but no other evidence of acute bowel obstruction and no free air. There is retained stool in the more distal colon. Electronically Signed   By: Genevie Ann M.D.   On: 12/23/2016 15:12    Review of Systems  Constitutional: Negative for chills and fever.  HENT: Negative for hearing loss.   Eyes: Negative for blurred vision.  Respiratory: Negative for cough and shortness of breath.   Cardiovascular: Negative for chest pain and claudication.  Gastrointestinal: Positive for abdominal pain, nausea and vomiting.  Genitourinary: Negative for dysuria, frequency and urgency.  Musculoskeletal: Negative for myalgias.  Skin: Negative for itching and rash.  Neurological: Negative for dizziness and headaches.   Blood pressure (!) 145/76,  pulse 70, temperature 98.6 F (37 C), temperature source Oral, resp. rate 18, SpO2 99 %. Physical Exam  Constitutional: She is oriented to person, place, and time. She appears well-developed and well-nourished.  HENT:  Head: Normocephalic and atraumatic.  Eyes: Conjunctivae and EOM are normal. Pupils are equal, round, and reactive to light.  Neck: Normal range of motion. Neck supple.  Cardiovascular: Normal rate and regular rhythm.   Respiratory: Effort normal and breath sounds normal. No respiratory distress.  GI: Soft. She exhibits no distension. There is tenderness (mid epigastric).  Musculoskeletal: Normal range  of motion.  Neurological: She is alert and oriented to person, place, and time.  Skin: Skin is warm and dry.    Assessment/Plan: Admit pt and start IVF's and abx.  Repeat labs in AM.  T bili normal, so no need for GI at this time.  Anticipate pt will be ready for lap chole Sun or Mon.  THOMAS, ALICIA C. 6/57/8469, 6:29 AM

## 2016-12-24 NOTE — H&P (Addendum)
Kimberly Brown TSV:[id number] DOB: 05/24/1951 DOA: 12/23/2016     PCP: Lisabeth Devoid, CRNA  [facility]: None Patient coming from:    home Lives   With family    Chief Complaint: abdominal pain  HPI: Kimberly Brown is a 65 y.o. female with medical history significant of DM2, HLD, abdominal hernia repair in 2005    Presented with 1 day hx of chills and sweats epigastric abdominal pain. No Hx of alcohol use, no fatty foods. Pain was worse with eating. No prior hx of gallstones or gall bladder diseases.  No chest pain no shortness of breath.  She went to urgent care and was told she has pancreatitis due to elevated lipase up to 1000. She was instructed to come to ER    Regarding pertinent Chronic problems: Dm diet controlled,  HLD diet controlled as well.  deneis hx of HTN, CAD, COPD or liver disease. No chest pain of dyspnea with exertion. Able to walk a flight of stairs with no problems.     Reports hx of alcohol abuse 22 years ago have been sober.   IN ER:  Temp (24hrs), Avg:98.8 F (37.1 C), Min:98.6 F (37 C), Max:98.9 F (37.2 C)      on arrival  ED Triage Vitals  Enc Vitals Group     BP 12/23/16 2051 (!) 154/77     Pulse Rate 12/23/16 2051 69     Resp 12/23/16 2051 18     Temp 12/23/16 2051 98.6 F (37 C)     Temp Source 12/23/16 2051 Oral     SpO2 12/23/16 2051 98 %     Weight --      Height --      Head Circumference --      Peak Flow --      Pain Score 12/23/16 1814 5     Pain Loc --      Pain Edu? --      Excl. in [place]? --     RR 18 99% H R 70 BP 145/76 Lipase 1391 lipase 667   Na 143 K 3.9  Cr 0.63  Glucose 156 LFT's wnl  WBC 14.2  Hg 14.9 PLT 275  [place]: Cholecystitis Following Medications were ordered in ER: Medications  piperacillin-tazobactam (ZOSYN) IVPB 3.375 g (not administered)  sodium chloride 0.9 % bolus 1,000 mL (1,000 mLs Intravenous New Bag/Given 12/23/16 2235)  promethazine (PHENERGAN) injection 12.5 mg  (12.5 mg Intravenous Given 12/23/16 2236)  pantoprazole (PROTONIX) injection 40 mg (40 mg Intravenous Given 12/23/16 2236)  cefTRIAXone (ROCEPHIN) 2 g in dextrose 5 % 50 mL IVPB (0 g Intravenous Stopped 12/23/16 2336)  diphenhydrAMINE (BENADRYL) injection 25 mg (25 mg Intravenous Given 12/23/16 2341)     ER provider discussed case with: general Surgery who is requesting admission to medicine  Hospitalist was called for admission for gall bladder pancreatitis in the setting of cholecystitis   Review of Systems:    Pertinent positives include: Fevers, chills, fatigue, , abdominal pain, nausea  Constitutional:  No weight loss, night sweats,  weight loss  HEENT:  No headaches, Difficulty swallowing,Tooth/dental problems,Sore throat,  No sneezing, itching, ear ache, nasal congestion, post nasal drip,  Cardio-vascular:  No chest pain, Orthopnea, PND, anasarca, dizziness, palpitations.no Bilateral lower extremity swelling  GI:  No heartburn, indigestion, vomiting, diarrhea, change in bowel habits, loss of appetite, melena, blood in stool, hematemesis Resp:  no shortness of breath at rest. No dyspnea on exertion,  No excess mucus, no productive cough, No non-productive cough, No coughing up of blood.No change in color of mucus.No wheezing. Skin:  no rash or lesions. No jaundice GU:  no dysuria, change in color of urine, no urgency or frequency. No straining to urinate.  No flank pain.  Musculoskeletal:  No joint pain or no joint swelling. No decreased range of motion. No back pain.  Psych:  No change in mood or affect. No depression or anxiety. No memory loss.  Neuro: no localizing neurological complaints, no tingling, no weakness, no double vision, no gait abnormality, no slurred speech, no confusion  As per HPI otherwise 10 point review of systems negative.   Past Medical History: Past Medical History:  Diagnosis Date  . Diabetes mellitus without complication ([place])   . Hernia of  abdominal cavity    Past Surgical History:  Procedure Laterality Date  . ABDOMINAL HYSTERECTOMY    . BASAL CELL CARCINOMA EXCISION       Social History:  Ambulatory   independently      reports that she has never smoked. She has never used smokeless tobacco. She reports that she does not drink alcohol or use drugs.  Allergies:  No Known Allergies     Family History:   Family History  Problem Relation Age of Onset  . Kidney failure Sister   . Cancer Mother 41       rectal  . Stroke Neg Hx     Medications: Prior to Admission medications   Medication Sig Start Date End Date Taking? Authorizing Provider  glucosamine-chondroitin 500-400 MG tablet Take 1 tablet by mouth 3 (three) times daily.   Yes [provider]  blood glucose meter kit and supplies KIT Dispense based on patient and insurance preference. Use up to four times daily as directed. (FOR ICD-10 E11.9). 12/08/16   Shawnee Knapp, MD    Physical Exam: Patient Vitals for the past 24 hrs:  BP Temp Temp src Pulse Resp SpO2  12/23/16 2330 (!) 145/76 - - 70 18 99 %  12/23/16 2243 (!) 132/56 - - 64 18 99 %  12/23/16 2051 (!) 154/77 98.6 F (37 C) Oral 69 18 98 %    1. General:  in No Acute distress 2. Psychological: Alert and   Oriented 3. Head/ENT:     Dry Mucous Membranes                          Head Non traumatic, neck supple                          Normal Dentition 4. SKIN:  decreased Skin turgor,  Skin clean Dry and intact no rash 5. Heart: Regular rate and rhythm no Murmur, Rub or gallop 6. Lungs:  Clear to auscultation bilaterally, no wheezes or crackles   7. Abdomen: Soft, RUQ tenderness, Non distended 8. Lower extremities: no clubbing, cyanosis, or edema 9. Neurologically Grossly intact, moving all 4 extremities equally   10. MSK: Normal range of motion   body mass index is unknown because there is no height or weight on file.  Labs on Admission:   Labs on Admission: I have personally  reviewed following labs and imaging studies  CBC:  Recent Labs Lab 12/23/16 1543 12/23/16 1828  WBC 12.6* 14.2*  HGB 15.4 14.9  HCT 43.5 44.8  MCV 85.5 90.3  PLT  --  275  Basic Metabolic Panel:  Recent Labs Lab 12/23/16 1548 12/23/16 1828  NA 146* 143  K 4.7 3.9  CL 104 109  CO2 25 23  GLUCOSE 181* 156*  BUN 14 14  CREATININE 0.78 0.63  CALCIUM 9.8 9.0   GFR: Estimated Creatinine Clearance: 70.9 mL/min (by C-G formula based on SCr of 0.63 mg/dL). Liver Function Tests:  Recent Labs Lab 12/23/16 1548 12/23/16 1828  AST 21 25  ALT 20 22  ALKPHOS 69 57  BILITOT 0.4 0.6  PROT 7.6 7.6  ALBUMIN 4.8 4.6    Recent Labs Lab 12/23/16 1548 12/23/16 1828  LIPASE 1,391* 667*  AMYLASE 612*  --    No results for input(s): AMMONIA in the last 168 hours. Coagulation Profile: No results for input(s): INR, PROTIME in the last 168 hours. Cardiac Enzymes: No results for input(s): CKTOTAL, CKMB, CKMBINDEX, TROPONINI in the last 168 hours. BNP (last 3 results) No results for input(s): PROBNP in the last 8760 hours. HbA1C: No results for input(s): HGBA1C in the last 72 hours. CBG: No results for input(s): GLUCAP in the last 168 hours. Lipid Profile: No results for input(s): CHOL, HDL, LDLCALC, TRIG, CHOLHDL, LDLDIRECT in the last 72 hours. Thyroid Function Tests: No results for input(s): TSH, T4TOTAL, FREET4, T3FREE, THYROIDAB in the last 72 hours. Anemia Panel: No results for input(s): VITAMINB12, FOLATE, FERRITIN, TIBC, IRON, RETICCTPCT in the last 72 hours. Urine analysis:    Component Value Date/Time   COLORURINE YELLOW 12/23/2016 1938   APPEARANCEUR CLEAR 12/23/2016 1938   LABSPEC 1.025 12/23/2016 1938   PHURINE 5.0 12/23/2016 1938   GLUCOSEU NEGATIVE 12/23/2016 1938   HGBUR SMALL (A) 12/23/2016 1938   BILIRUBINUR NEGATIVE 12/23/2016 1938   BILIRUBINUR small (A) 12/23/2016 1700   KETONESUR 20 (A) 12/23/2016 1938   PROTEINUR 30 (A) 12/23/2016 1938    UROBILINOGEN 0.2 12/23/2016 1700   NITRITE NEGATIVE 12/23/2016 1938   LEUKOCYTESUR NEGATIVE 12/23/2016 1938   Sepsis Labs: @LABRCNTIP (procalcitonin:4,lacticidven:4) )No results found for this or any previous visit (from the past 240 hour(s)).     UA  no evidence of UTI    Lab Results  Component Value Date   HGBA1C 6.2 12/08/2016    Estimated Creatinine Clearance: 70.9 mL/min (by C-G formula based on SCr of 0.63 mg/dL).  BNP (last 3 results) No results for input(s): PROBNP in the last 8760 hours.   ECG REPORT  Independently reviewed Rate: 63  Rhythm: Sinus rhythm border line short PR interval  ST&T Change:early repolarization  QTC 452  There were no vitals filed for this visit.   Cultures: No results found for: SDES, [place], CULT, REPTSTATUS   Radiological Exams on Admission: US Abdomen Limited  Result Date: 12/23/2016 CLINICAL DATA:  Upper abdominal pain and increased lipase. Pancreatitis. EXAM: ULTRASOUND ABDOMEN LIMITED RIGHT UPPER QUADRANT COMPARISON:  None. FINDINGS: Gallbladder: The gallbladder is normally distended. There is marked edema of the gallbladder wall measuring up to 7 mm. Echogenic foci within the gallbladder may represent gallbladder stones/sludge. The largest gallstone measures 8 mm. Sonographic Murphy's sign was reported as negative. Common bile duct: Diameter: 4.7 mm in maximum transverse dimension. Liver: No focal lesion identified. Within normal limits in parenchymal echogenicity. IMPRESSION: Marked gallbladder wall edema with thickening of up to 7 mm, findings usually associated with acute cholecystitis. Cholelithiasis. Sonographic Murphy's sign was reported as negative. Was the patient given pain medications prior to her ultrasound exam? Electronically Signed   By: Fidela Salisbury M.D.   On: 12/23/2016 21:43  Dg Abd 2 Views  Result Date: 12/23/2016 CLINICAL DATA:  66 year old female with abdominal pain distention nausea and vomiting for 1  day. EXAM: ABDOMEN - 2 VIEW COMPARISON:  CT Abdomen and Pelvis 06/21/2016 and earlier. FINDINGS: Upright and supine views of the abdomen and pelvis. Negative lung bases. No pneumoperitoneum. Retained stool in the colon. There is mild to moderate gaseous distention of the ascending colon and hepatic flexure, with a paucity of bowel gas elsewhere. Still, there is no dilated small bowel. No right colon abnormality was identified at the time of the CT in December. Evidence of decompressed small bowel loops in the left and right lower abdomen. There is some gas in the descending colon and sigmoid. No acute osseous abnormality identified. Other abdominal and pelvic visceral contours are within normal limits. IMPRESSION: Conspicuous gaseous distension of the right colon, but no other evidence of acute bowel obstruction and no free air. There is retained stool in the more distal colon. Electronically Signed   By: Genevie Ann M.D.   On: 12/23/2016 15:12    Chart has been reviewed    Assessment/Plan  66 y.o. female with medical history significant of DM2, HLD, abdominal hernia repair in 2005 Admitted for gall bladder pancreatitis in the setting of cholecystitis    Present on Admission: . Pancreatitis - most likely due to Gallstone pancreatitits, will obtain CT abd, rehydrate keep nothing by mouth. Folow lipase trend . Acute cholecystitis - treats her Zosyn appreciate general surgery input patient will likely need surgical intervention, monitor for any sign of sepsis given fevers at home obtain blood cultures currently afebrile . Type 2 diabetes mellitus with hyperlipidemia (HCC) - sliding scale ordered Shortened PR interval patient denies history of syncope except for when she was pregnant no history of early deaths in the family. EKG personally reviewed as well as interviewed if cardiology no indication for father cardiac workup prior to proceeding to OR  Other plan as per orders.  DVT prophylaxis:  SCD       Code Status:  FULL CODE  as per patient    Family Communication:   Family   at  Bedside  plan of care was discussed with   Son  Disposition Plan:                                         Consults called: general surgery, discussed ECG with cardiology  Admission status:   inpatient      Level of care    medical floor         I have spent a total of 56 min on this admission extra time was taken to discuss case with cardiology Watkins 12/24/2016, 1:48 AM    [facility]  Pager [phone]   after 2 AM please page floor coverage PA If 7AM-7PM, please contact the day team taking care of the patient  [email or web address]  Password [email or web address]

## 2016-12-24 NOTE — Progress Notes (Addendum)
[facility]  PROGRESS NOTE  Kimberly Brown EVO:[id number] DOB: 1951-03-07 DOA: 12/23/2016 PCP: Lisabeth Devoid, CRNA   Brief HPI:    66 y.o. female with medical history significant of DM2, HLD, abdominal hernia repair in 2005    Presented with 1 day hx of chills and sweats epigastric abdominal pain. No Hx of alcohol use, no fatty foods. Pain was worse with eating. No prior hx of gallstones or gall bladder diseases.  No chest pain no shortness of breath.  She went to urgent care and was told she has pancreatitis due to elevated lipase up to 1000. She was instructed to come to ER    Subjective   Patient denies pain this morning. No nausea or vomiting.   Assessment/Plan:     1. Gallstone pancreatitis- patient presented with rigid lipase 1391 at urgent care, this morning lipase is down to 190. Patient currently nothing by mouth getting IV fluids.  2. Acute cholecystitis- ultrasound abdomen shows acute cholecystitis, general surgery has  plans for laparoscopic cholecystectomy in a.m.Continue Zosyn per pharmacy consultation. 3. Diabetes mellitus- continue sliding scale insulin with NovoLog. 4. Short PR interval-noted on EKG, admitting physician discussed with cardiology, no further cardiac workup prior to proceeding to OR. 5. Liver lesion- CT scan shows 15 mm lesion in the posterior dome of the  right liver. MRI recommended as outpatient for further evaluation.    DVT prophylaxis: Lovenox  Code Status: Full code  Family Communication: Discussed with patient's son at bedside   Disposition Plan: Likely home after surgery   Consultants:  Gen. surgery  Procedures:  None  Continuous infusions . sodium chloride 125 mL/hr at 12/24/16 0958  . piperacillin-tazobactam (ZOSYN)  IV 3.375 g (12/24/16 0958)      Antibiotics:   Anti-infectives    Start     Dose/Rate Route Frequency Ordered Stop   12/24/16 0800  piperacillin-tazobactam (ZOSYN) IVPB 3.375 g      3.375 g 12.5 mL/hr over 240 Minutes Intravenous Every 8 hours 12/24/16 0240     12/24/16 0100  piperacillin-tazobactam (ZOSYN) IVPB 3.375 g     3.375 g 100 mL/hr over 30 Minutes Intravenous  Once 12/24/16 0052 12/24/16 0206   12/23/16 2145  cefTRIAXone (ROCEPHIN) 2 g in dextrose 5 % 50 mL IVPB     2 g 100 mL/hr over 30 Minutes Intravenous  Once 12/23/16 2137 12/23/16 2336       Objective   Vitals:   12/24/16 0130 12/24/16 0237 12/24/16 0607 12/24/16 1021  BP: (!) 138/58 (!) 140/56 (!) 124/48 (!) 154/61  Pulse: 85 60 89 (!) 59  Resp: (!) 22 12 16 14   Temp:  98.7 F (37.1 C) 98.4 F (36.9 C) 98.5 F (36.9 C)  TempSrc:  Oral Oral Oral  SpO2: 96% 100% 96% 98%  Weight:  77.3 kg (170 lb 6.7 oz)    Height:  5\' 4"  (1.626 m)      Intake/Output Summary (Last 24 hours) at 12/24/16 1210 Last data filed at 12/24/16 0600  Gross per 24 hour  Intake          1456.25 ml  Output                0 ml  Net          1456.25 ml   Filed Weights   12/24/16 0237  Weight: 77.3 kg (170 lb 6.7 oz)     Physical Examination:   Physical Exam: Eyes: No icterus, extraocular muscles  intact  Mouth: Oral mucosa is moist, no lesions on palate,  Neck: Supple, no deformities, masses, or tenderness Lungs: Normal respiratory effort, bilateral clear to auscultation, no crackles or wheezes.  Heart: Regular rate and rhythm, S1 and S2 normal, no murmurs, rubs auscultated Abdomen: BS normoactive,soft,nondistended,tender to palpation in epigastric region,no organomegaly Extremities: No pretibial edema, no erythema, no cyanosis, no clubbing Neuro : Alert and oriented to time, place and person, No focal deficits Skin: No rashes seen on exam    Data Reviewed: I have personally reviewed following labs and imaging studies  CBG:  Recent Labs Lab 12/24/16 0520 12/24/16 0753 12/24/16 1202  GLUCAP 145* 135* 143*    CBC:  Recent Labs Lab 12/23/16 1543 12/23/16 1828 12/24/16 0429  WBC 12.6* 14.2*  13.2*  HGB 15.4 14.9 12.3  HCT 43.5 44.8 38.3  MCV 85.5 90.3 90.5  PLT  --  275 212    Basic Metabolic Panel:  Recent Labs Lab 12/23/16 1548 12/23/16 1828 12/24/16 0429  NA 146* 143 143  K 4.7 3.9 3.6  CL 104 109 111  CO2 25 23 24   GLUCOSE 181* 156* 164*  BUN 14 14 15   CREATININE 0.78 0.63 0.60  CALCIUM 9.8 9.0 8.3*  MG  --   --  1.8  PHOS  --   --  3.4    No results found for this or any previous visit (from the past 240 hour(s)).   Liver Function Tests:  Recent Labs Lab 12/23/16 1548 12/23/16 1828 12/24/16 0429  AST 21 25 13*  ALT 20 22 16   ALKPHOS 69 57 45  BILITOT 0.4 0.6 0.7  PROT 7.6 7.6 6.3*  ALBUMIN 4.8 4.6 3.6    Recent Labs Lab 12/23/16 1548 12/23/16 1828 12/24/16 0429  LIPASE 1,391* 667* 190*  AMYLASE 612*  --   --    No results for input(s): AMMONIA in the last 168 hours.  Cardiac Enzymes: No results for input(s): CKTOTAL, CKMB, CKMBINDEX, TROPONINI in the last 168 hours. BNP (last 3 results) No results for input(s): BNP in the last 8760 hours.  ProBNP (last 3 results) No results for input(s): PROBNP in the last 8760 hours.    Studies: Ct Abdomen Pelvis W Contrast  Result Date: 12/24/2016 CLINICAL DATA:  Abdominal pain with multiple episodes of vomiting EXAM: CT ABDOMEN AND PELVIS WITH CONTRAST TECHNIQUE: Multidetector CT imaging of the abdomen and pelvis was performed using the standard protocol following bolus administration of intravenous contrast. CONTRAST:  188mL ISOVUE-300 IOPAMIDOL (ISOVUE-300) INJECTION 61% COMPARISON:  12/23/2016, 06/21/2016 FINDINGS: Lower chest: Lung bases demonstrate no acute consolidation or effusion. Normal heart size. Hepatobiliary: Fatty liver. Focal 15 mm hyper enhancement within the posterior dome of the right lobe with surrounding relative increased density of the liver parenchyma. Additional areas of increased density near the gallbladder fossa possibly due to fatty sparing. No biliary dilatation. Mild  enhancement of the gallbladder wall with mild wall thickening. Pancreas: Mild soft tissue stranding at the distal body and tail of pancreas with small fluid in the anterior pararenal space suspicious for mild pancreatitis. No ductal dilatation. Spleen: Normal in size without focal abnormality. Adrenals/Urinary Tract: Adrenal glands within normal limits. subcentimeter cortical hypodense lesions in the kidneys too small to further characterize. Stable cyst lower pole left kidney. Bladder normal Stomach/Bowel: Stomach is within normal limits. Appendix appears normal. No evidence of bowel wall thickening, distention, or inflammatory changes. Vascular/Lymphatic: No significant vascular findings are present. No enlarged abdominal or pelvic lymph nodes.  Reproductive: Status post hysterectomy. No adnexal masses. Other: No free air. Musculoskeletal: None IMPRESSION: 1. Gallbladder wall enhancement with surrounding mild edema or gallbladder wall thickening, findings are suspect for mild acute cholecystitis as noted on prior ultrasound. No biliary dilatation. 2. There is soft tissue stranding around the distal body and tail of the pancreas with fluid in the left anterior pararenal space, suspicious for acute pancreatitis. 3. Focal 15 mm hyperenhancing lesion in the posterior dome of the right liver. Underlying liver density appears fatty with hyperdense areas near the gallbladder fossa and posterior dome of the liver possibly due to fatty sparing. Recommend nonemergent MRI for further evaluation. Electronically Signed   By: Donavan Foil M.D.   On: 12/24/2016 02:26   US Abdomen Limited  Result Date: 12/23/2016 CLINICAL DATA:  Upper abdominal pain and increased lipase. Pancreatitis. EXAM: ULTRASOUND ABDOMEN LIMITED RIGHT UPPER QUADRANT COMPARISON:  None. FINDINGS: Gallbladder: The gallbladder is normally distended. There is marked edema of the gallbladder wall measuring up to 7 mm. Echogenic foci within the gallbladder may  represent gallbladder stones/sludge. The largest gallstone measures 8 mm. Sonographic Murphy's sign was reported as negative. Common bile duct: Diameter: 4.7 mm in maximum transverse dimension. Liver: No focal lesion identified. Within normal limits in parenchymal echogenicity. IMPRESSION: Marked gallbladder wall edema with thickening of up to 7 mm, findings usually associated with acute cholecystitis. Cholelithiasis. Sonographic Murphy's sign was reported as negative. Was the patient given pain medications prior to her ultrasound exam? Electronically Signed   By: Fidela Salisbury M.D.   On: 12/23/2016 21:43   Dg Abd 2 Views  Result Date: 12/23/2016 CLINICAL DATA:  66 year old female with abdominal pain distention nausea and vomiting for 1 day. EXAM: ABDOMEN - 2 VIEW COMPARISON:  CT Abdomen and Pelvis 06/21/2016 and earlier. FINDINGS: Upright and supine views of the abdomen and pelvis. Negative lung bases. No pneumoperitoneum. Retained stool in the colon. There is mild to moderate gaseous distention of the ascending colon and hepatic flexure, with a paucity of bowel gas elsewhere. Still, there is no dilated small bowel. No right colon abnormality was identified at the time of the CT in December. Evidence of decompressed small bowel loops in the left and right lower abdomen. There is some gas in the descending colon and sigmoid. No acute osseous abnormality identified. Other abdominal and pelvic visceral contours are within normal limits. IMPRESSION: Conspicuous gaseous distension of the right colon, but no other evidence of acute bowel obstruction and no free air. There is retained stool in the more distal colon. Electronically Signed   By: Genevie Ann M.D.   On: 12/23/2016 15:12    Scheduled Meds: . insulin aspart  0-9 Units Subcutaneous Q4H  . iopamidol          Time spent: 25 min  [facility] Pager [phone]. If 7PM-7AM, please contact night-coverage at  [email or web address], Office  [phone]  password [email or web address] 12/24/2016, 12:10 PM  LOS: 0 days

## 2016-12-24 NOTE — Telephone Encounter (Signed)
Pt is wanting to let Kimberly Brown know that she is having gallbladder surgery and was diagnosed with pancreatitis  Best number [phone]

## 2016-12-25 ENCOUNTER — Inpatient Hospital Stay (HOSPITAL_COMMUNITY): Payer: BLUE CROSS/BLUE SHIELD | Admitting: Anesthesiology

## 2016-12-25 ENCOUNTER — Encounter (HOSPITAL_COMMUNITY)
Admission: EM | Disposition: A | Discharge status: Home / Self Care | Payer: Self-pay | Source: Home / Self Care | Attending: Family Medicine

## 2016-12-25 ENCOUNTER — Encounter (HOSPITAL_COMMUNITY): Payer: Self-pay | Admitting: Certified Registered"

## 2016-12-25 ENCOUNTER — Inpatient Hospital Stay (HOSPITAL_COMMUNITY): Payer: BLUE CROSS/BLUE SHIELD

## 2016-12-25 DIAGNOSIS — E119 Type 2 diabetes mellitus without complications: Secondary | ICD-10-CM

## 2016-12-25 HISTORY — PX: CHOLECYSTECTOMY: SHX55

## 2016-12-25 LAB — GLUCOSE, CAPILLARY
GLUCOSE-CAPILLARY: 124 mg/dL — AB (ref 65–99)
GLUCOSE-CAPILLARY: 129 mg/dL — AB (ref 65–99)
GLUCOSE-CAPILLARY: 163 mg/dL — AB (ref 65–99)
Glucose-Capillary: 125 mg/dL — ABNORMAL HIGH (ref 65–99)
Glucose-Capillary: 145 mg/dL — ABNORMAL HIGH (ref 65–99)
Glucose-Capillary: 151 mg/dL — ABNORMAL HIGH (ref 65–99)
Glucose-Capillary: 166 mg/dL — ABNORMAL HIGH (ref 65–99)

## 2016-12-25 LAB — SURGICAL PCR SCREEN
MRSA, PCR: NEGATIVE
STAPHYLOCOCCUS AUREUS: POSITIVE — AB

## 2016-12-25 LAB — CBC
HCT: 37.3 % (ref 36.0–46.0)
HEMOGLOBIN: 12.1 g/dL (ref 12.0–15.0)
MCH: 29.3 pg (ref 26.0–34.0)
MCHC: 32.4 g/dL (ref 30.0–36.0)
MCV: 90.3 fL (ref 78.0–100.0)
Platelets: 259 10*3/uL (ref 150–400)
RBC: 4.13 MIL/uL (ref 3.87–5.11)
RDW: 13.8 % (ref 11.5–15.5)
WBC: 14.3 10*3/uL — AB (ref 4.0–10.5)

## 2016-12-25 LAB — HEMOGLOBIN A1C
HEMOGLOBIN A1C: 6.1 % — AB (ref 4.8–5.6)
Mean Plasma Glucose: 128 mg/dL

## 2016-12-25 LAB — LIPASE, BLOOD: LIPASE: 28 U/L (ref 11–51)

## 2016-12-25 SURGERY — LAPAROSCOPIC CHOLECYSTECTOMY WITH INTRAOPERATIVE CHOLANGIOGRAM
Anesthesia: General | Site: Abdomen

## 2016-12-25 MED ORDER — SUCCINYLCHOLINE CHLORIDE 200 MG/10ML IV SOSY
PREFILLED_SYRINGE | INTRAVENOUS | Status: DC | PRN
  Administered 2016-12-25: 120 mg via INTRAVENOUS

## 2016-12-25 MED ORDER — SODIUM CHLORIDE 0.9 % IV SOLN: INTRAVENOUS | Status: DC

## 2016-12-25 MED ORDER — CHLORHEXIDINE GLUCONATE CLOTH 2 % EX PADS: 6.0000 | MEDICATED_PAD | Freq: Every day | CUTANEOUS | Status: DC

## 2016-12-25 MED ORDER — DEXAMETHASONE SODIUM PHOSPHATE 10 MG/ML IJ SOLN
INTRAMUSCULAR | Status: DC | PRN
  Administered 2016-12-25: 10 mg via INTRAVENOUS

## 2016-12-25 MED ORDER — HYDROCODONE-ACETAMINOPHEN 5-325 MG PO TABS
1.0000 | ORAL_TABLET | ORAL | Status: DC | PRN
  Administered 2016-12-26: 1 via ORAL
  Filled 2016-12-25: qty 2

## 2016-12-25 MED ORDER — PHENYLEPHRINE 40 MCG/ML (10ML) SYRINGE FOR IV PUSH (FOR BLOOD PRESSURE SUPPORT)
PREFILLED_SYRINGE | INTRAVENOUS | Status: AC
  Filled 2016-12-25: qty 10

## 2016-12-25 MED ORDER — IOPAMIDOL (ISOVUE-300) INJECTION 61%
INTRAVENOUS | Status: AC
  Filled 2016-12-25: qty 50

## 2016-12-25 MED ORDER — PROPOFOL 10 MG/ML IV BOLUS
INTRAVENOUS | Status: DC | PRN
  Administered 2016-12-25: 160 mg via INTRAVENOUS

## 2016-12-25 MED ORDER — MIDAZOLAM HCL 5 MG/5ML IJ SOLN
INTRAMUSCULAR | Status: DC | PRN
  Administered 2016-12-25: 2 mg via INTRAVENOUS

## 2016-12-25 MED ORDER — SUCCINYLCHOLINE CHLORIDE 200 MG/10ML IV SOSY
PREFILLED_SYRINGE | INTRAVENOUS | Status: AC
  Filled 2016-12-25: qty 10

## 2016-12-25 MED ORDER — ROCURONIUM BROMIDE 10 MG/ML (PF) SYRINGE
PREFILLED_SYRINGE | INTRAVENOUS | Status: DC | PRN
  Administered 2016-12-25: 30 mg via INTRAVENOUS

## 2016-12-25 MED ORDER — FENTANYL CITRATE (PF) 100 MCG/2ML IJ SOLN
INTRAMUSCULAR | Status: DC | PRN
  Administered 2016-12-25 (×5): 50 ug via INTRAVENOUS

## 2016-12-25 MED ORDER — SODIUM CHLORIDE 0.9 % IV SOLN
INTRAVENOUS | Status: DC | PRN
  Administered 2016-12-25: 10:00:00 via INTRAVENOUS

## 2016-12-25 MED ORDER — DEXAMETHASONE SODIUM PHOSPHATE 10 MG/ML IJ SOLN
INTRAMUSCULAR | Status: AC
  Filled 2016-12-25: qty 1

## 2016-12-25 MED ORDER — ONDANSETRON HCL 4 MG/2ML IJ SOLN
INTRAMUSCULAR | Status: AC
  Filled 2016-12-25: qty 2

## 2016-12-25 MED ORDER — LIDOCAINE 2% (20 MG/ML) 5 ML SYRINGE
INTRAMUSCULAR | Status: AC
  Filled 2016-12-25: qty 5

## 2016-12-25 MED ORDER — ONDANSETRON HCL 4 MG/2ML IJ SOLN
INTRAMUSCULAR | Status: DC | PRN
  Administered 2016-12-25: 4 mg via INTRAVENOUS

## 2016-12-25 MED ORDER — SUGAMMADEX SODIUM 200 MG/2ML IV SOLN
INTRAVENOUS | Status: AC
  Filled 2016-12-25: qty 2

## 2016-12-25 MED ORDER — SUGAMMADEX SODIUM 200 MG/2ML IV SOLN
INTRAVENOUS | Status: DC | PRN
  Administered 2016-12-25: 175 mg via INTRAVENOUS

## 2016-12-25 MED ORDER — BUPIVACAINE-EPINEPHRINE 0.25% -1:200000 IJ SOLN
INTRAMUSCULAR | Status: DC | PRN
  Administered 2016-12-25: 20 mL

## 2016-12-25 MED ORDER — FENTANYL CITRATE (PF) 250 MCG/5ML IJ SOLN
INTRAMUSCULAR | Status: AC
  Filled 2016-12-25: qty 5

## 2016-12-25 MED ORDER — FENTANYL CITRATE (PF) 100 MCG/2ML IJ SOLN
INTRAMUSCULAR | Status: AC
  Filled 2016-12-25: qty 2

## 2016-12-25 MED ORDER — FENTANYL CITRATE (PF) 100 MCG/2ML IJ SOLN
25.0000 ug | INTRAMUSCULAR | Status: DC | PRN
  Administered 2016-12-25: 50 ug via INTRAVENOUS

## 2016-12-25 MED ORDER — MUPIROCIN 2 % EX OINT
1.0000 "application " | TOPICAL_OINTMENT | Freq: Two times a day (BID) | CUTANEOUS | Status: DC
  Administered 2016-12-25 – 2016-12-27 (×4): 1 via NASAL
  Filled 2016-12-25: qty 22

## 2016-12-25 MED ORDER — LIDOCAINE 2% (20 MG/ML) 5 ML SYRINGE
INTRAMUSCULAR | Status: DC | PRN
  Administered 2016-12-25: 100 mg via INTRAVENOUS

## 2016-12-25 MED ORDER — BUPIVACAINE-EPINEPHRINE (PF) 0.25% -1:200000 IJ SOLN
INTRAMUSCULAR | Status: AC
  Filled 2016-12-25: qty 30

## 2016-12-25 MED ORDER — SODIUM CHLORIDE 0.9 % IV SOLN
INTRAVENOUS | Status: DC | PRN
  Administered 2016-12-25: 11:00:00

## 2016-12-25 MED ORDER — ROCURONIUM BROMIDE 50 MG/5ML IV SOSY
PREFILLED_SYRINGE | INTRAVENOUS | Status: AC
  Filled 2016-12-25: qty 5

## 2016-12-25 MED ORDER — LACTATED RINGERS IR SOLN
Status: DC | PRN
  Administered 2016-12-25: 1000 mL

## 2016-12-25 MED ORDER — KCL IN DEXTROSE-NACL 20-5-0.45 MEQ/L-%-% IV SOLN
INTRAVENOUS | Status: DC
  Administered 2016-12-25 – 2016-12-26 (×2): via INTRAVENOUS
  Filled 2016-12-25 (×2): qty 1000

## 2016-12-25 MED ORDER — MIDAZOLAM HCL 2 MG/2ML IJ SOLN
INTRAMUSCULAR | Status: AC
  Filled 2016-12-25: qty 2

## 2016-12-25 MED ORDER — PROPOFOL 10 MG/ML IV BOLUS
INTRAVENOUS | Status: AC
  Filled 2016-12-25: qty 20

## 2016-12-25 SURGICAL SUPPLY — 33 items
ADH SKN CLS APL DERMABOND .7 (GAUZE/BANDAGES/DRESSINGS) ×1
APPLIER CLIP 5 13 M/L LIGAMAX5 (MISCELLANEOUS) ×2
APR CLP MED LRG 5 ANG JAW (MISCELLANEOUS) ×1
BAG SPEC RTRVL LRG 6X4 10 (ENDOMECHANICALS) ×1
CABLE HIGH FREQUENCY MONO STRZ (ELECTRODE) ×2 IMPLANT
CHLORAPREP W/TINT 26ML (MISCELLANEOUS) ×2 IMPLANT
CLIP APPLIE 5 13 M/L LIGAMAX5 (MISCELLANEOUS) ×1 IMPLANT
COVER MAYO STAND STRL (DRAPES) ×2 IMPLANT
DERMABOND ADVANCED (GAUZE/BANDAGES/DRESSINGS) ×1
DERMABOND ADVANCED .7 DNX12 (GAUZE/BANDAGES/DRESSINGS) ×1 IMPLANT
DRAPE C-ARM 42X120 X-RAY (DRAPES) ×2 IMPLANT
ELECT REM PT RETURN 15FT ADLT (MISCELLANEOUS) ×2 IMPLANT
GLOVE BIO SURGEON STRL SZ 6.5 (GLOVE) ×2 IMPLANT
GLOVE BIOGEL PI IND STRL 7.0 (GLOVE) ×1 IMPLANT
GLOVE BIOGEL PI INDICATOR 7.0 (GLOVE) ×1
GOWN STRL REUS W/TWL 2XL LVL3 (GOWN DISPOSABLE) ×2 IMPLANT
GOWN STRL REUS W/TWL XL LVL3 (GOWN DISPOSABLE) ×4 IMPLANT
IRRIG SUCT STRYKERFLOW 2 WTIP (MISCELLANEOUS) ×2
IRRIGATION SUCT STRKRFLW 2 WTP (MISCELLANEOUS) ×1 IMPLANT
IV CATH 14GX2 1/4 (CATHETERS) ×2 IMPLANT
KIT BASIN OR (CUSTOM PROCEDURE TRAY) ×2 IMPLANT
POUCH SPECIMEN RETRIEVAL 10MM (ENDOMECHANICALS) ×2 IMPLANT
SCISSORS LAP 5X35 DISP (ENDOMECHANICALS) ×2 IMPLANT
SET CHOLANGIOGRAPH MIX (MISCELLANEOUS) ×2 IMPLANT
SUT VIC AB 2-0 SH 27 (SUTURE) ×2
SUT VIC AB 2-0 SH 27X BRD (SUTURE) ×1 IMPLANT
SUT VIC AB 4-0 PS2 18 (SUTURE) ×2 IMPLANT
TOWEL OR 17X26 10 PK STRL BLUE (TOWEL DISPOSABLE) ×2 IMPLANT
TOWEL OR NON WOVEN STRL DISP B (DISPOSABLE) ×2 IMPLANT
TRAY LAPAROSCOPIC (CUSTOM PROCEDURE TRAY) ×2 IMPLANT
TROCAR BLADELESS OPT 12M 100M (ENDOMECHANICALS) IMPLANT
TROCAR XCEL BLUNT TIP 100MML (ENDOMECHANICALS) ×2 IMPLANT
TUBING INSUF HEATED (TUBING) ×2 IMPLANT

## 2016-12-25 NOTE — Progress Notes (Signed)
[facility]  PROGRESS NOTE  Kimberly Brown BTD:[id number] DOB: 05-05-1951 DOA: 12/23/2016 PCP: Lisabeth Devoid, CRNA   Brief HPI:    66 y.o. female with medical history significant of DM2, HLD, abdominal hernia repair in 2005    Presented with 1 day hx of chills and sweats epigastric abdominal pain. No Hx of alcohol use, no fatty foods. Pain was worse with eating. No prior hx of gallstones or gall bladder diseases.  No chest pain no shortness of breath.  She went to urgent care and was told she has pancreatitis due to elevated lipase up to 1000. She was instructed to come to ER    Subjective   Patient seen and examined, status post laparoscopic cholecystectomy.   Assessment/Plan:     1. Gallstone pancreatitis- patient presented with rigid lipase 1391 at urgent care, this morning lipase came down to 190. Patient currently nothing by mouth getting IV fluids.   2. Acute cholecystitis- ultrasound abdomen shows acute cholecystitis, patient underwent laparoscopic cholecystectomy. General surgery following. Patient is on IV zosyn. ?? Need for antibiotics. Will await surgery recommendation. 3. Diabetes mellitus- continue sliding scale insulin with NovoLog. 4. Short PR interval-noted on EKG, admitting physician discussed with cardiology, no further cardiac workup prior to proceeding to OR. 5. Liver lesion- CT scan shows 15 mm lesion in the posterior dome of the  right liver. MRI recommended as outpatient for further evaluation.    DVT prophylaxis: Lovenox  Code Status: Full code  Family Communication: Discussed with patient's son at bedside   Disposition Plan: Likely home after surgery   Consultants:  Gen. surgery  Procedures:  None  Continuous infusions . piperacillin-tazobactam (ZOSYN)  IV 3.375 g (12/25/16 0745)      Antibiotics:   Anti-infectives    Start     Dose/Rate Route Frequency Ordered Stop   12/24/16 0800  piperacillin-tazobactam (ZOSYN)  IVPB 3.375 g     3.375 g 12.5 mL/hr over 240 Minutes Intravenous Every 8 hours 12/24/16 0240     12/24/16 0100  piperacillin-tazobactam (ZOSYN) IVPB 3.375 g     3.375 g 100 mL/hr over 30 Minutes Intravenous  Once 12/24/16 0052 12/24/16 0206   12/23/16 2145  cefTRIAXone (ROCEPHIN) 2 g in dextrose 5 % 50 mL IVPB     2 g 100 mL/hr over 30 Minutes Intravenous  Once 12/23/16 2137 12/23/16 2336       Objective   Vitals:   12/25/16 1200 12/25/16 1209 12/25/16 1227 12/25/16 1230  BP: 138/75 132/69 (!) 153/72 131/76  Pulse: 68 68 63   Resp: 18 20 18    Temp:  98.5 F (36.9 C) 98.2 F (36.8 C)   TempSrc:      SpO2: 98% 95% 97%   Weight:      Height:        Intake/Output Summary (Last 24 hours) at 12/25/16 1330 Last data filed at 12/25/16 1207  Gross per 24 hour  Intake             1200 ml  Output                0 ml  Net             1200 ml   Filed Weights   12/24/16 0237  Weight: 77.3 kg (170 lb 6.7 oz)     Physical Examination:  Physical Exam: Eyes: No icterus, extraocular muscles intact  Mouth: Oral mucosa is moist, no lesions on palate,  Neck: Supple,  no deformities, masses, or tenderness Lungs: Normal respiratory effort, bilateral clear to auscultation, no crackles or wheezes.  Heart: Regular rate and rhythm, S1 and S2 normal, no murmurs, rubs auscultated Abdomen: BS normoactive,soft,nondistended,tender to palpation in RUQ,no organomegaly Extremities: No pretibial edema, no erythema, no cyanosis, no clubbing Neuro : Alert and oriented to time, place and person, No focal deficits Skin: No rashes seen on exam   Data Reviewed: I have personally reviewed following labs and imaging studies  CBG:  Recent Labs Lab 12/24/16 2111 12/25/16 0111 12/25/16 0536 12/25/16 0754 12/25/16 1134  GLUCAP 133* 145* 125* 124* 129*    CBC:  Recent Labs Lab 12/23/16 1543 12/23/16 1828 12/24/16 0429 12/25/16 0525  WBC 12.6* 14.2* 13.2* 14.3*  HGB 15.4 14.9 12.3 12.1   HCT 43.5 44.8 38.3 37.3  MCV 85.5 90.3 90.5 90.3  PLT  --  275 293 229    Basic Metabolic Panel:  Recent Labs Lab 12/23/16 1548 12/23/16 1828 12/24/16 0429  NA 146* 143 143  K 4.7 3.9 3.6  CL 104 109 111  CO2 25 23 24   GLUCOSE 181* 156* 164*  BUN 14 14 15   CREATININE 0.78 0.63 0.60  CALCIUM 9.8 9.0 8.3*  MG  --   --  1.8  PHOS  --   --  3.4    Recent Results (from the past 240 hour(s))  Culture, blood (Routine X 2) w Reflex to ID Panel     Status: None (Preliminary result)   Collection Time: 12/24/16  1:42 AM  Result Value Ref Range Status   Specimen Description BLOOD LEFT ANTECUBITAL  Final   Special Requests   Final    BOTTLES DRAWN AEROBIC AND ANAEROBIC Blood Culture adequate volume   Culture   Final    NO GROWTH 1 DAY Performed at [facility], [address], [place], [place] [address]    Report Status PENDING  Incomplete  Culture, blood (Routine X 2) w Reflex to ID Panel     Status: None (Preliminary result)   Collection Time: 12/24/16  4:29 AM  Result Value Ref Range Status   Specimen Description BLOOD RIGHT ANTECUBITAL  Final   Special Requests IN PEDIATRIC BOTTLE Blood Culture adequate volume  Final   Culture   Final    NO GROWTH 1 DAY Performed at [facility], [place] [address] [place], [place] [address]    Report Status PENDING  Incomplete  Surgical pcr screen     Status: Abnormal   Collection Time: 12/24/16  9:25 PM  Result Value Ref Range Status   MRSA, PCR NEGATIVE NEGATIVE Final   Staphylococcus aureus POSITIVE (A) NEGATIVE Final    Comment:        The Xpert SA Assay (FDA approved for NASAL specimens in patients over 28 years of age), is one component of a comprehensive surveillance program.  Test performance has been validated by [facility] for patients greater than or equal to 62 year old. It is not intended to diagnose infection nor to guide or monitor treatment.      Liver Function Tests:  Recent Labs Lab  12/23/16 1548 12/23/16 1828 12/24/16 0429  AST 21 25 13*  ALT 20 22 16   ALKPHOS 69 57 45  BILITOT 0.4 0.6 0.7  PROT 7.6 7.6 6.3*  ALBUMIN 4.8 4.6 3.6    Recent Labs Lab 12/23/16 1548 12/23/16 1828 12/24/16 0429 12/25/16 0525  LIPASE 1,391* 667* 190* 28  AMYLASE 612*  --   --   --  No results for input(s): AMMONIA in the last 168 hours.  Cardiac Enzymes: No results for input(s): CKTOTAL, CKMB, CKMBINDEX, TROPONINI in the last 168 hours. BNP (last 3 results) No results for input(s): BNP in the last 8760 hours.  ProBNP (last 3 results) No results for input(s): PROBNP in the last 8760 hours.    Studies: Ct Abdomen Pelvis W Contrast  Result Date: 12/24/2016 CLINICAL DATA:  Abdominal pain with multiple episodes of vomiting EXAM: CT ABDOMEN AND PELVIS WITH CONTRAST TECHNIQUE: Multidetector CT imaging of the abdomen and pelvis was performed using the standard protocol following bolus administration of intravenous contrast. CONTRAST:  113mL ISOVUE-300 IOPAMIDOL (ISOVUE-300) INJECTION 61% COMPARISON:  12/23/2016, 06/21/2016 FINDINGS: Lower chest: Lung bases demonstrate no acute consolidation or effusion. Normal heart size. Hepatobiliary: Fatty liver. Focal 15 mm hyper enhancement within the posterior dome of the right lobe with surrounding relative increased density of the liver parenchyma. Additional areas of increased density near the gallbladder fossa possibly due to fatty sparing. No biliary dilatation. Mild enhancement of the gallbladder wall with mild wall thickening. Pancreas: Mild soft tissue stranding at the distal body and tail of pancreas with small fluid in the anterior pararenal space suspicious for mild pancreatitis. No ductal dilatation. Spleen: Normal in size without focal abnormality. Adrenals/Urinary Tract: Adrenal glands within normal limits. subcentimeter cortical hypodense lesions in the kidneys too small to further characterize. Stable cyst lower pole left kidney.  Bladder normal Stomach/Bowel: Stomach is within normal limits. Appendix appears normal. No evidence of bowel wall thickening, distention, or inflammatory changes. Vascular/Lymphatic: No significant vascular findings are present. No enlarged abdominal or pelvic lymph nodes. Reproductive: Status post hysterectomy. No adnexal masses. Other: No free air. Musculoskeletal: None IMPRESSION: 1. Gallbladder wall enhancement with surrounding mild edema or gallbladder wall thickening, findings are suspect for mild acute cholecystitis as noted on prior ultrasound. No biliary dilatation. 2. There is soft tissue stranding around the distal body and tail of the pancreas with fluid in the left anterior pararenal space, suspicious for acute pancreatitis. 3. Focal 15 mm hyperenhancing lesion in the posterior dome of the right liver. Underlying liver density appears fatty with hyperdense areas near the gallbladder fossa and posterior dome of the liver possibly due to fatty sparing. Recommend nonemergent MRI for further evaluation. Electronically Signed   By: Donavan Foil M.D.   On: 12/24/2016 02:26   US Abdomen Limited  Result Date: 12/23/2016 CLINICAL DATA:  Upper abdominal pain and increased lipase. Pancreatitis. EXAM: ULTRASOUND ABDOMEN LIMITED RIGHT UPPER QUADRANT COMPARISON:  None. FINDINGS: Gallbladder: The gallbladder is normally distended. There is marked edema of the gallbladder wall measuring up to 7 mm. Echogenic foci within the gallbladder may represent gallbladder stones/sludge. The largest gallstone measures 8 mm. Sonographic Murphy's sign was reported as negative. Common bile duct: Diameter: 4.7 mm in maximum transverse dimension. Liver: No focal lesion identified. Within normal limits in parenchymal echogenicity. IMPRESSION: Marked gallbladder wall edema with thickening of up to 7 mm, findings usually associated with acute cholecystitis. Cholelithiasis. Sonographic Murphy's sign was reported as negative. Was the  patient given pain medications prior to her ultrasound exam? Electronically Signed   By: Fidela Salisbury M.D.   On: 12/23/2016 21:43   Dg Abd 2 Views  Result Date: 12/23/2016 CLINICAL DATA:  66 year old female with abdominal pain distention nausea and vomiting for 1 day. EXAM: ABDOMEN - 2 VIEW COMPARISON:  CT Abdomen and Pelvis 06/21/2016 and earlier. FINDINGS: Upright and supine views of the abdomen and pelvis. Negative lung  bases. No pneumoperitoneum. Retained stool in the colon. There is mild to moderate gaseous distention of the ascending colon and hepatic flexure, with a paucity of bowel gas elsewhere. Still, there is no dilated small bowel. No right colon abnormality was identified at the time of the CT in December. Evidence of decompressed small bowel loops in the left and right lower abdomen. There is some gas in the descending colon and sigmoid. No acute osseous abnormality identified. Other abdominal and pelvic visceral contours are within normal limits. IMPRESSION: Conspicuous gaseous distension of the right colon, but no other evidence of acute bowel obstruction and no free air. There is retained stool in the more distal colon. Electronically Signed   By: Genevie Ann M.D.   On: 12/23/2016 15:12    Scheduled Meds: . enoxaparin (LOVENOX) injection  40 mg Subcutaneous Q24H  . fentaNYL      . insulin aspart  0-9 Units Subcutaneous Q4H  . mupirocin ointment  1 application Nasal BID      Time spent: 25 min  [facility] Pager [phone]. If 7PM-7AM, please contact night-coverage at [email or web address], Office  [phone]  password [email or web address] 12/25/2016, 1:30 PM  LOS: 1 day

## 2016-12-25 NOTE — Op Note (Signed)
12/23/2016 - 12/25/2016  11:10 AM  PATIENT:  Kimberly Brown  66 y.o. female  Patient Care Team: Lisabeth Devoid, CRNA as PCP - General  PRE-OPERATIVE DIAGNOSIS:  Cholecystitis, gallstone pancreatitis  POST-OPERATIVE DIAGNOSIS:  Cholecystitis, gallstone pancreatitis  PROCEDURE:   LAPAROSCOPIC CHOLECYSTECTOMY WITH INTRAOPERATIVE CHOLANGIOGRAM  SURGEON:  Surgeon(s): Leighton Ruff, MD Alphonsa Overall, MD  ASSISTANT: Dr Lucia Gaskins   ANESTHESIA:   local and general  EBL:  No intake/output data recorded.  DRAINS: none   SPECIMEN:  Source of Specimen:  gallbladder  DISPOSITION OF SPECIMEN:  PATHOLOGY  COUNTS:  YES  PLAN OF CARE: Pt admitted  PATIENT DISPOSITION:  PACU - hemodynamically stable.  INDICATION: 66 y.o. F with gallstone pancreatitis and cholecystitis.  The anatomy & physiology of hepatobiliary & pancreatic function was discussed.  The pathophysiology of gallbladder dysfunction was discussed.  Natural history risks without surgery was discussed.   I feel the risks of no intervention will lead to serious problems that outweigh the operative risks; therefore, I recommended cholecystectomy to remove the pathology.  I explained laparoscopic techniques with possible need for an open approach.  Probable cholangiogram to evaluate the bilary tract was explained as well.    Risks such as bleeding, infection, abscess, leak, injury to other organs, need for further treatment, heart attack, death, and other risks were discussed.  I noted a good likelihood this will help address the problem.  Possibility that this will not correct all abdominal symptoms was explained.  Goals of post-operative recovery were discussed as well.    OR FINDINGS: patent CBD with no filling defects.  Edematous gallbladder  DESCRIPTION:   The patient was identified & brought into the operating room. The patient was positioned supine with arms tucked. SCDs were active during the entire case. The patient  underwent general anesthesia without any difficulty.  The abdomen was prepped and draped in a sterile fashion. A Surgical Timeout was performed and confirmed our plan.  We positioned the patient in reverse [place] & right side up.  I placed a Hassan laparoscopic port through the umbilicus using open entry technique.  Entry was clean. There were no adhesions to the anterior abdominal wall supraumbilically.  We induced carbon dioxide insufflation. Camera inspection revealed no injury.    I proceeded to continue with laparoscopic technique. I placed a 5 mm port in mid subcostal region, another 93mm port in the right flank near the anterior axillary line, and a 8mm port in the left subxiphoid region obliquely within the falciform ligament.  I turned attention to the right upper quadrant.    The gallbladder fundus was elevated cephalad. I used cautery and blunt dissection to free the peritoneal coverings between the gallbladder and the liver on the posteriolateral and anteriomedial walls.   I used careful blunt and cautery dissection with a maryland dissector to help get a good critical view of the cystic artery and cystic duct. I did further dissection to free a few centimeters of the  gallbladder off the liver bed to get a good critical view of the infundibulum and cystic duct. I mobilized the cystic artery.  I skeletonized the cystic duct.  After getting a good 360 view, I decided to perform a cholangiogram.  I placed a clip on the infundibulum.   I did a partial cystic duct-otomy and ensured patency. I placed a 5 F cholangiocatheter through a puncture site at the right subcostal ridge of the abdominal wall and directed it into the cystic duct.  This was secured with a clip. We ran a cholangiogram with dilute radio-opaque contrast and continuous fluoroscopy.  Contrast flowed from a side branch consistent with cystic duct cannulization. Contrast flowed up the common hepatic duct into the right and left  intrahepatic chains out to secondary radicals. Contrast flowed down the common bile duct easily across the normal ampulla into the duodenum.  This was consistent with a normal cholangiogram.  I removed the cholangiocatheter.  I placed clips on the cystic duct x3.  I completed cystic duct transection.   I placed clips on the cystic artery x3 with 2 proximally.  I ligated the cystic artery using scissors. I freed the gallbladder from its remaining attachments to the liver. I ensured hemostasis on the gallbladder fossa of the liver and elsewhere. I inspected the rest of the abdomen & detected no injury nor bleeding elsewhere.  I irrigated the RUQ with normal saline.  I removed the gallbladder through the umbilical port site.  I closed the umbilical fascia using 0 Vicryl stitches x2.   I closed the skin using 4-0 vicryl stitch.  Sterile dressings were applied. The patient was extubated & arrived in the PACU in stable condition.  I had discussed postoperative care with the patient in the holding area.   I will discuss  operative findings and postoperative goals / instructions with the patient's family.  Instructions are written in the chart as well.

## 2016-12-25 NOTE — Anesthesia Preprocedure Evaluation (Addendum)
Anesthesia Evaluation  Patient identified by MRN, date of birth, ID band Patient awake    Reviewed: Allergy & Precautions, H&P , Patient's Chart, lab work & pertinent test results, reviewed documented beta blocker date and time   Airway Mallampati: II  TM Distance: <3 FB Neck ROM: full  Mouth opening: Limited Mouth Opening  Dental no notable dental hx.    Pulmonary former smoker,    Pulmonary exam normal breath sounds clear to auscultation       Cardiovascular  Rhythm:regular Rate:Normal     Neuro/Psych    GI/Hepatic   Endo/Other  diabetes, Type 2  Renal/GU      Musculoskeletal   Abdominal   Peds  Hematology   Anesthesia Other Findings   Reproductive/Obstetrics                           Anesthesia Physical Anesthesia Plan  ASA: II  Anesthesia Plan: General   Post-op Pain Management:    Induction: Intravenous  PONV Risk Score and Plan: 2 and Ondansetron, Dexamethasone and Propofol  Airway Management Planned: Oral ETT and Video Laryngoscope Planned  Additional Equipment:   Intra-op Plan:   Post-operative Plan: Extubation in OR  Informed Consent: I have reviewed the patients History and Physical, chart, labs and discussed the procedure including the risks, benefits and alternatives for the proposed anesthesia with the patient or authorized representative who has indicated his/her understanding and acceptance.   Dental Advisory Given and Dental advisory given  Plan Discussed with: CRNA and Surgeon  Anesthesia Plan Comments: ( Hx od possible DI; throat was sore and reports coughing up bloody mucus post op for TAH in 2005  Old record not available )       Anesthesia Quick Evaluation

## 2016-12-25 NOTE — Transfer of Care (Signed)
Immediate Anesthesia Transfer of Care Note  Patient: Kimberly Brown  Procedure(s) Performed: Procedure(s): LAPAROSCOPIC CHOLECYSTECTOMY WITH INTRAOPERATIVE CHOLANGIOGRAM (N/A)  Patient Location: PACU  Anesthesia Type:General  Level of Consciousness: awake, alert  and oriented  Airway & Oxygen Therapy: Patient Spontanous Breathing and Patient connected to face mask oxygen  Post-op Assessment: Report given to RN and Post -op Vital signs reviewed and stable  Post vital signs: Reviewed and stable  Last Vitals:  Vitals:   12/25/16 0114 12/25/16 0540  BP: (!) 142/72 138/67  Pulse: 66 64  Resp: 17 18  Temp: 37 C 37 C    Last Pain:  Vitals:   12/25/16 0540  TempSrc: Oral  PainSc:       Patients Stated Pain Goal: 3 (57/84/69 6295)  Complications: No apparent anesthesia complications

## 2016-12-25 NOTE — Progress Notes (Signed)
66 y.o. F with gallstone pancreatitis  Subjective: Pt feeling better  Objective: Vital signs in last 24 hours: Temp:  [98.4 F (36.9 C)-99 F (37.2 C)] 98.6 F (37 C) (06/17 0540) Pulse Rate:  [59-70] 64 (06/17 0540) Resp:  [14-18] 18 (06/17 0540) BP: (138-154)/(56-72) 138/67 (06/17 0540) SpO2:  [95 %-99 %] 96 % (06/17 0540) Last BM Date: 12/23/16  Intake/Output from previous day: 06/16 0701 - 06/17 0700 In: 350 [P.O.:200; IV Piggyback:150] Out: -  Intake/Output this shift: No intake/output data recorded.  General appearance: alert and cooperative GI: soft, less tender  Lab Results:  Results for orders placed or performed during the hospital encounter of 12/23/16 (from the past 24 hour(s))  Glucose, capillary     Status: Abnormal   Collection Time: 12/24/16 12:02 PM  Result Value Ref Range   Glucose-Capillary 143 (H) 65 - 99 mg/dL   Comment 1 Notify RN   Glucose, capillary     Status: Abnormal   Collection Time: 12/24/16  5:20 PM  Result Value Ref Range   Glucose-Capillary 119 (H) 65 - 99 mg/dL   Comment 1 Notify RN   Glucose, capillary     Status: Abnormal   Collection Time: 12/24/16  9:11 PM  Result Value Ref Range   Glucose-Capillary 133 (H) 65 - 99 mg/dL  Surgical pcr screen     Status: Abnormal   Collection Time: 12/24/16  9:25 PM  Result Value Ref Range   MRSA, PCR NEGATIVE NEGATIVE   Staphylococcus aureus POSITIVE (A) NEGATIVE  Glucose, capillary     Status: Abnormal   Collection Time: 12/25/16  1:11 AM  Result Value Ref Range   Glucose-Capillary 145 (H) 65 - 99 mg/dL  Lipase, blood     Status: None   Collection Time: 12/25/16  5:25 AM  Result Value Ref Range   Lipase 28 11 - 51 U/L  CBC     Status: Abnormal   Collection Time: 12/25/16  5:25 AM  Result Value Ref Range   WBC 14.3 (H) 4.0 - 10.5 K/uL   RBC 4.13 3.87 - 5.11 MIL/uL   Hemoglobin 12.1 12.0 - 15.0 g/dL   HCT 37.3 36.0 - 46.0 %   MCV 90.3 78.0 - 100.0 fL   MCH 29.3 26.0 - 34.0 pg   MCHC  32.4 30.0 - 36.0 g/dL   RDW 13.8 11.5 - 15.5 %   Platelets 259 150 - 400 K/uL  Glucose, capillary     Status: Abnormal   Collection Time: 12/25/16  7:54 AM  Result Value Ref Range   Glucose-Capillary 124 (H) 65 - 99 mg/dL     Studies/Results Radiology     MEDS, Scheduled . Chlorhexidine Gluconate Cloth  6 each Topical Daily  . enoxaparin (LOVENOX) injection  40 mg Subcutaneous Q24H  . insulin aspart  0-9 Units Subcutaneous Q4H  . mupirocin ointment  1 application Nasal BID     Assessment: Gallstone pancreatitis.  No biliary obstruction.  Lipase down, wbc up a bit   Plan:  Lipase normalized OR today for lap chole and IOC  COnt IV abx  The anatomy & physiology of hepatobiliary & pancreatic function was discussed.  The pathophysiology of gallbladder dysfunction was discussed.  Natural history risks without surgery was discussed.   I feel the risks of no intervention will lead to serious problems that outweigh the operative risks; therefore, I recommended cholecystectomy to remove the pathology.  I explained laparoscopic techniques with possible need for an open  approach.  Probable cholangiogram to evaluate the bilary tract was explained as well.    Risks such as bleeding, infection, abscess, leak, injury to other organs, need for further treatment, heart attack, death, and other risks were discussed.  I noted a good likelihood this will help address the problem.  Possibility that this will not correct all abdominal symptoms was explained.  Goals of post-operative recovery were discussed as well.  We will work to minimize complications.  An educational handout further explaining the pathology and treatment options was given as well.  Questions were answered.  The patient expresses understanding & wishes to proceed with surgery.   LOS: 1 day    Rosario Adie, MD [facility], [place]   12/25/2016 8:00 AM

## 2016-12-25 NOTE — Anesthesia Procedure Notes (Signed)
Procedure Name: Intubation Date/Time: 12/25/2016 9:51 AM Performed by: Noralyn Pick D Pre-anesthesia Checklist: Patient identified, Emergency Drugs available, Suction available and Patient being monitored Patient Re-evaluated:Patient Re-evaluated prior to inductionOxygen Delivery Method: Circle system utilized Preoxygenation: Pre-oxygenation with 100% oxygen Intubation Type: IV induction and Rapid sequence Laryngoscope Size: Glidescope Grade View: Grade I Tube type: Parker flex tip Number of attempts: 1 Airway Equipment and Method: Stylet and Oral airway Placement Confirmation: ETT inserted through vocal cords under direct vision,  positive ETCO2 and breath sounds checked- equal and bilateral Secured at: 21 cm Tube secured with: Tape Dental Injury: Teeth and Oropharynx as per pre-operative assessment

## 2016-12-26 LAB — GLUCOSE, CAPILLARY
GLUCOSE-CAPILLARY: 102 mg/dL — AB (ref 65–99)
GLUCOSE-CAPILLARY: 122 mg/dL — AB (ref 65–99)
Glucose-Capillary: 154 mg/dL — ABNORMAL HIGH (ref 65–99)
Glucose-Capillary: 126 mg/dL — ABNORMAL HIGH (ref 65–99)
Glucose-Capillary: 112 mg/dL — ABNORMAL HIGH (ref 65–99)
Glucose-Capillary: 103 mg/dL — ABNORMAL HIGH (ref 65–99)

## 2016-12-26 MED ORDER — PROMETHAZINE HCL 25 MG/ML IJ SOLN
12.5000 mg | Freq: Four times a day (QID) | INTRAMUSCULAR | Status: DC | PRN
  Administered 2016-12-26 – 2016-12-27 (×3): 12.5 mg via INTRAVENOUS
  Filled 2016-12-26 (×3): qty 1

## 2016-12-26 MED ORDER — ONDANSETRON HCL 4 MG PO TABS: 4.0000 mg | ORAL_TABLET | Freq: Every day | ORAL | 1 refills | Status: DC | PRN

## 2016-12-26 MED ORDER — SODIUM CHLORIDE 0.9 % IV SOLN
INTRAVENOUS | Status: DC
  Administered 2016-12-26: 16:00:00 via INTRAVENOUS
  Filled 2016-12-26 (×4): qty 1000

## 2016-12-26 MED ORDER — HYDROCODONE-ACETAMINOPHEN 5-325 MG PO TABS: 1.0000 | ORAL_TABLET | ORAL | 0 refills | Status: DC | PRN

## 2016-12-26 NOTE — Progress Notes (Signed)
[facility]  PROGRESS NOTE  Kimberly Brown DEY:[id number] DOB: May 07, 1951 DOA: 12/23/2016 PCP: Lisabeth Devoid, CRNA   Brief HPI:    66 y.o. female with medical history significant of DM2, HLD, abdominal hernia repair in 2005    Presented with 1 day hx of chills and sweats epigastric abdominal pain. No Hx of alcohol use, no fatty foods. Pain was worse with eating. No prior hx of gallstones or gall bladder diseases.  No chest pain no shortness of breath.  She went to urgent care and was told she has pancreatitis due to elevated lipase up to 1000. She was instructed to come to ER    Subjective   Patient was released supposed to be discharged home, but started vomiting after eating food.    Assessment/Plan:     1. Gallstone pancreatitis- patient presented with rigid lipase 1391 at urgent care, this lipase went  down to 190. Patient again started on IV normal saline at 100 ml/hr. we'll check lipase in a.m. 2. Acute cholecystitis- ultrasound abdomen shows acute cholecystitis, patient underwent laparoscopic cholecystectomy per general surgery. Started on clear liquid diet which was advanced to call notified and patient started vomiting. So discharge is currently held. Start Phenergan 12.5 mg IV every 6 hours when necessary for nausea and vomiting. Will discontinue IV antibiotics. 3. Diabetes mellitus- continue sliding scale insulin with NovoLog. 4. Short PR interval-noted on EKG, admitting physician discussed with cardiology, no further cardiac workup prior to proceeding to OR. 5. Liver lesion- CT scan shows 15 mm lesion in the posterior dome of the  right liver. MRI recommended as outpatient for further evaluation.    DVT prophylaxis: Lovenox  Code Status: Full code  Family Communication: Discussed with patient's son at bedside   Disposition Plan: Likely home  In am   Consultants:  Gen. surgery  Procedures:  None  Continuous infusions . sodium chloride 0.9 %  1,000 mL infusion 100 mL/hr at 12/26/16 1556      Antibiotics:   Anti-infectives    Start     Dose/Rate Route Frequency Ordered Stop   12/24/16 0800  piperacillin-tazobactam (ZOSYN) IVPB 3.375 g  Status:  Discontinued     3.375 g 12.5 mL/hr over 240 Minutes Intravenous Every 8 hours 12/24/16 0240 12/26/16 1451   12/24/16 0100  piperacillin-tazobactam (ZOSYN) IVPB 3.375 g     3.375 g 100 mL/hr over 30 Minutes Intravenous  Once 12/24/16 0052 12/24/16 0206   12/23/16 2145  cefTRIAXone (ROCEPHIN) 2 g in dextrose 5 % 50 mL IVPB     2 g 100 mL/hr over 30 Minutes Intravenous  Once 12/23/16 2137 12/23/16 2336       Objective   Vitals:   12/25/16 1230 12/25/16 2031 12/26/16 0416 12/26/16 1409  BP: 131/76 139/60 (!) 124/55 (!) 163/68  Pulse:  62 (!) 58 60  Resp:  18 18 18   Temp:  98.3 F (36.8 C) 97.6 F (36.4 C) 98 F (36.7 C)  TempSrc:  Oral Oral Oral  SpO2:  95% 94% 99%  Weight:      Height:        Intake/Output Summary (Last 24 hours) at 12/26/16 1607 Last data filed at 12/26/16 1601  Gross per 24 hour  Intake          2006.67 ml  Output              600 ml  Net          1406.67 ml  Filed Weights   12/24/16 0237  Weight: 77.3 kg (170 lb 6.7 oz)     Physical Examination:   Physical Exam: Eyes: No icterus, extraocular muscles intact  Mouth: Oral mucosa is moist, no lesions on palate,  Neck: Supple, no deformities, masses, or tenderness Lungs: Normal respiratory effort, bilateral clear to auscultation, no crackles or wheezes.  Heart: Regular rate and rhythm, S1 and S2 normal, no murmurs, rubs auscultated Abdomen: BS normoactive,soft,nondistended,non-tender to palpation,no organomegaly Extremities: No pretibial edema, no erythema, no cyanosis, no clubbing Neuro : Alert and oriented to time, place and person, No focal deficits Skin: No rashes seen on exam    Data Reviewed: I have personally reviewed following labs and imaging studies  CBG:  Recent  Labs Lab 12/25/16 2028 12/25/16 2358 12/26/16 0413 12/26/16 0751 12/26/16 1158  GLUCAP 163* 166* 154* 126* 112*    CBC:  Recent Labs Lab 12/23/16 1543 12/23/16 1828 12/24/16 0429 12/25/16 0525  WBC 12.6* 14.2* 13.2* 14.3*  HGB 15.4 14.9 12.3 12.1  HCT 43.5 44.8 38.3 37.3  MCV 85.5 90.3 90.5 90.3  PLT  --  275 293 102    Basic Metabolic Panel:  Recent Labs Lab 12/23/16 1548 12/23/16 1828 12/24/16 0429  NA 146* 143 143  K 4.7 3.9 3.6  CL 104 109 111  CO2 25 23 24   GLUCOSE 181* 156* 164*  BUN 14 14 15   CREATININE 0.78 0.63 0.60  CALCIUM 9.8 9.0 8.3*  MG  --   --  1.8  PHOS  --   --  3.4    Recent Results (from the past 240 hour(s))  Culture, blood (Routine X 2) w Reflex to ID Panel     Status: None (Preliminary result)   Collection Time: 12/24/16  1:42 AM  Result Value Ref Range Status   Specimen Description BLOOD LEFT ANTECUBITAL  Final   Special Requests   Final    BOTTLES DRAWN AEROBIC AND ANAEROBIC Blood Culture adequate volume   Culture   Final    NO GROWTH 2 DAYS Performed at [facility], [address], [place], [place] [address]    Report Status PENDING  Incomplete  Culture, blood (Routine X 2) w Reflex to ID Panel     Status: None (Preliminary result)   Collection Time: 12/24/16  4:29 AM  Result Value Ref Range Status   Specimen Description BLOOD RIGHT ANTECUBITAL  Final   Special Requests IN PEDIATRIC BOTTLE Blood Culture adequate volume  Final   Culture   Final    NO GROWTH 2 DAYS Performed at [facility], [place] [address], [place], [place] [address]    Report Status PENDING  Incomplete  Surgical pcr screen     Status: Abnormal   Collection Time: 12/24/16  9:25 PM  Result Value Ref Range Status   MRSA, PCR NEGATIVE NEGATIVE Final   Staphylococcus aureus POSITIVE (A) NEGATIVE Final    Comment:        The Xpert SA Assay (FDA approved for NASAL specimens in patients over 24 years of age), is one component of a comprehensive  surveillance program.  Test performance has been validated by [facility] for patients greater than or equal to 57 year old. It is not intended to diagnose infection nor to guide or monitor treatment.      Liver Function Tests:  Recent Labs Lab 12/23/16 1548 12/23/16 1828 12/24/16 0429  AST 21 25 13*  ALT 20 22 16   ALKPHOS  69 57 45  BILITOT 0.4 0.6 0.7  PROT 7.6 7.6 6.3*  ALBUMIN 4.8 4.6 3.6    Recent Labs Lab 12/23/16 1548 12/23/16 1828 12/24/16 0429 12/25/16 0525  LIPASE 1,391* 667* 190* 28  AMYLASE 612*  --   --   --    No results for input(s): AMMONIA in the last 168 hours.  Cardiac Enzymes: No results for input(s): CKTOTAL, CKMB, CKMBINDEX, TROPONINI in the last 168 hours. BNP (last 3 results) No results for input(s): BNP in the last 8760 hours.  ProBNP (last 3 results) No results for input(s): PROBNP in the last 8760 hours.    Studies: Dg Cholangiogram Operative  Result Date: 12/25/2016 CLINICAL DATA:  66 year old female with a history of cholelithiasis EXAM: INTRAOPERATIVE CHOLANGIOGRAM TECHNIQUE: Cholangiographic images from the C-arm fluoroscopic device were submitted for interpretation post-operatively. Please see the procedural report for the amount of contrast and the fluoroscopy time utilized. FLUOROSCOPY TIME:  Fluoroscopy Time:  8 seconds COMPARISON:  CT 12/24/2016 FINDINGS: Surgical instruments project over the upper abdomen. There is cannulation of the cystic duct/gallbladder neck, with antegrade infusion of contrast. Caliber of the extrahepatic ductal system within normal limits. No large filling defect identified. Free flow of contrast across the ampulla. IMPRESSION: Intraoperative cholangiogram demonstrates extrahepatic biliary ducts of unremarkable caliber, with no large filling defect identified. Free flow of contrast across the ampulla. Please refer to the dictated operative report for full details of intraoperative findings and procedure  Electronically Signed   By: Corrie Mckusick D.O.   On: 12/25/2016 14:27    Scheduled Meds: . enoxaparin (LOVENOX) injection  40 mg Subcutaneous Q24H  . insulin aspart  0-9 Units Subcutaneous Q4H  . mupirocin ointment  1 application Nasal BID      Time spent: 25 min  [facility] Pager [phone]. If 7PM-7AM, please contact night-coverage at [email or web address], Office  [phone]  password [email or web address] 12/26/2016, 4:07 PM  LOS: 2 days

## 2016-12-26 NOTE — Discharge Summary (Addendum)
Physician Discharge Summary  Kimberly Brown SNK:[id number] DOB: 1951/05/09 DOA: 12/23/2016  PCP: Lisabeth Devoid, CRNA  Admit date: 12/23/2016 Discharge date: 12/26/2016  Time spent: 25* minutes  Recommendations for Outpatient Follow-up:  1. Follow up general surgery in 1-2 weeks 2. Follow up PCP in 2 weeks, will need MRI of liver as outpatient for evaluation of liver lesion.  Discharge Diagnoses:  Active Problems:   Pancreatitis   Acute cholecystitis   DM (diabetes mellitus), type 2 ([place])   Type 2 diabetes mellitus with hyperlipidemia ([place])   Shortened PR interval   Discharge Condition: Stable  Diet recommendation: Regular diet  Filed Weights   12/24/16 0237  Weight: 77.3 kg (170 lb 6.7 oz)    History of present illness:  66 y.o.femalewith medical history significant of DM2, HLD, abdominal hernia repair in 2005   Presented with 1 day hx of chills and sweats epigastric abdominal pain. No Hx of alcohol use, no fatty foods. Pain was worse with eating. No prior hx of gallstones or gall bladder diseases.  No chest pain no shortness of breath.  She went to urgent care and was told she has pancreatitis due to elevated lipase up to 1000. She was instructed to come to ER  Hospital Course:  1. Gallstone pancreatitis- patient presented with rigid lipase 1391 at urgent care,  lipase came down to 190. Patient started on carb modified diet. Patient vomited after she ate food. Called and discussed Dr. Lucia Gaskins who recommends that patient can be discharged on full liquid diet and take Zofran when necessary for nausea and vomiting. Discharged on Zofran 4 mg by mouth daily when necessary for nausea and vomiting. 2. Acute cholecystitis- ultrasound abdomen shows acute cholecystitis, patient underwent laparoscopic cholecystectomy. General surgery has requested to discharge home. Tolerating by mouth diet well. 3. Diabetes mellitus-  patient is not on medications at home 4. Short PR  interval-noted on EKG, admitting physician discussed with cardiology, no further cardiac workup 5. Liver lesion- CT scan shows 15 mm lesion in the posterior dome of the  right liver. MRI recommended as outpatient for further evaluation.   Procedures: Laparoscopic cholecystectomy with intraoperative cholangiogram  Pulse: 62 (!) 58  Resp: 18 18  Temp: 98.3 F (36.8 C) 97.6 F (36.4 C)    General: appears in no acute distress  Cardiovascular:S1-S2 regular Respiratory: Clear to auscultation bilaterally  Discharge Instructions   Discharge Instructions    Diet - low sodium heart healthy    Complete by:  As directed    Increase activity slowly    Complete by:  As directed      Current Discharge Medication List    START taking these medications   Details  HYDROcodone-acetaminophen (NORCO/VICODIN) 5-325 MG tablet Take 1 tablet by mouth every 4 (four) hours as needed for moderate pain or severe pain. Qty: 30 tablet, Refills: 0      CONTINUE these medications which have NOT CHANGED   Details  glucosamine-chondroitin 500-400 MG tablet Take 1 tablet by mouth 3 (three) times daily.    blood glucose meter kit and supplies KIT Dispense based on patient and insurance preference. Use up to four times daily as directed. (FOR ICD-10 E11.9). Qty: 1 each, Refills: 0   Associated Diagnoses: Type 2 diabetes mellitus without complication, without long-term current use of insulin ([place])       No Known Allergies Follow-up Information    Sur[facility], [facility] Follow up.   Specialty:  General Surgery Why:  Our office  will call you with follow up appointment.  If you do not hear by 6/20 call and ask for appointment with the "[facility]," clinic. Contact information: [place] [address] [phone]            The results of significant diagnostics from this hospitalization (including imaging, microbiology, ancillary and laboratory) are listed below for  reference.    Significant Diagnostic Studies: Dg Cholangiogram Operative  Result Date: 12/25/2016 CLINICAL DATA:  66 year old female with a history of cholelithiasis EXAM: INTRAOPERATIVE CHOLANGIOGRAM TECHNIQUE: Cholangiographic images from the C-arm fluoroscopic device were submitted for interpretation post-operatively. Please see the procedural report for the amount of contrast and the fluoroscopy time utilized. FLUOROSCOPY TIME:  Fluoroscopy Time:  8 seconds COMPARISON:  CT 12/24/2016 FINDINGS: Surgical instruments project over the upper abdomen. There is cannulation of the cystic duct/gallbladder neck, with antegrade infusion of contrast. Caliber of the extrahepatic ductal system within normal limits. No large filling defect identified. Free flow of contrast across the ampulla. IMPRESSION: Intraoperative cholangiogram demonstrates extrahepatic biliary ducts of unremarkable caliber, with no large filling defect identified. Free flow of contrast across the ampulla. Please refer to the dictated operative report for full details of intraoperative findings and procedure Electronically Signed   By: Corrie Mckusick D.O.   On: 12/25/2016 14:27   Ct Abdomen Pelvis W Contrast  Result Date: 12/24/2016 CLINICAL DATA:  Abdominal pain with multiple episodes of vomiting EXAM: CT ABDOMEN AND PELVIS WITH CONTRAST TECHNIQUE: Multidetector CT imaging of the abdomen and pelvis was performed using the standard protocol following bolus administration of intravenous contrast. CONTRAST:  186m ISOVUE-300 IOPAMIDOL (ISOVUE-300) INJECTION 61% COMPARISON:  12/23/2016, 06/21/2016 FINDINGS: Lower chest: Lung bases demonstrate no acute consolidation or effusion. Normal heart size. Hepatobiliary: Fatty liver. Focal 15 mm hyper enhancement within the posterior dome of the right lobe with surrounding relative increased density of the liver parenchyma. Additional areas of increased density near the gallbladder fossa possibly due to fatty  sparing. No biliary dilatation. Mild enhancement of the gallbladder wall with mild wall thickening. Pancreas: Mild soft tissue stranding at the distal body and tail of pancreas with small fluid in the anterior pararenal space suspicious for mild pancreatitis. No ductal dilatation. Spleen: Normal in size without focal abnormality. Adrenals/Urinary Tract: Adrenal glands within normal limits. subcentimeter cortical hypodense lesions in the kidneys too small to further characterize. Stable cyst lower pole left kidney. Bladder normal Stomach/Bowel: Stomach is within normal limits. Appendix appears normal. No evidence of bowel wall thickening, distention, or inflammatory changes. Vascular/Lymphatic: No significant vascular findings are present. No enlarged abdominal or pelvic lymph nodes. Reproductive: Status post hysterectomy. No adnexal masses. Other: No free air. Musculoskeletal: None IMPRESSION: 1. Gallbladder wall enhancement with surrounding mild edema or gallbladder wall thickening, findings are suspect for mild acute cholecystitis as noted on prior ultrasound. No biliary dilatation. 2. There is soft tissue stranding around the distal body and tail of the pancreas with fluid in the left anterior pararenal space, suspicious for acute pancreatitis. 3. Focal 15 mm hyperenhancing lesion in the posterior dome of the right liver. Underlying liver density appears fatty with hyperdense areas near the gallbladder fossa and posterior dome of the liver possibly due to fatty sparing. Recommend nonemergent MRI for further evaluation. Electronically Signed   By: KDonavan FoilM.D.   On: 12/24/2016 02:26   UKoreaAbdomen Limited  Result Date: 12/23/2016 CLINICAL DATA:  Upper abdominal pain and increased lipase. Pancreatitis. EXAM: ULTRASOUND ABDOMEN LIMITED RIGHT UPPER  QUADRANT COMPARISON:  None. FINDINGS: Gallbladder: The gallbladder is normally distended. There is marked edema of the gallbladder wall measuring up to 7 mm.  Echogenic foci within the gallbladder may represent gallbladder stones/sludge. The largest gallstone measures 8 mm. Sonographic Murphy's sign was reported as negative. Common bile duct: Diameter: 4.7 mm in maximum transverse dimension. Liver: No focal lesion identified. Within normal limits in parenchymal echogenicity. IMPRESSION: Marked gallbladder wall edema with thickening of up to 7 mm, findings usually associated with acute cholecystitis. Cholelithiasis. Sonographic Murphy's sign was reported as negative. Was the patient given pain medications prior to her ultrasound exam? Electronically Signed   By: Fidela Salisbury M.D.   On: 12/23/2016 21:43   Dg Abd 2 Views  Result Date: 12/23/2016 CLINICAL DATA:  66 year old female with abdominal pain distention nausea and vomiting for 1 day. EXAM: ABDOMEN - 2 VIEW COMPARISON:  CT Abdomen and Pelvis 06/21/2016 and earlier. FINDINGS: Upright and supine views of the abdomen and pelvis. Negative lung bases. No pneumoperitoneum. Retained stool in the colon. There is mild to moderate gaseous distention of the ascending colon and hepatic flexure, with a paucity of bowel gas elsewhere. Still, there is no dilated small bowel. No right colon abnormality was identified at the time of the CT in December. Evidence of decompressed small bowel loops in the left and right lower abdomen. There is some gas in the descending colon and sigmoid. No acute osseous abnormality identified. Other abdominal and pelvic visceral contours are within normal limits. IMPRESSION: Conspicuous gaseous distension of the right colon, but no other evidence of acute bowel obstruction and no free air. There is retained stool in the more distal colon. Electronically Signed   By: Genevie Ann M.D.   On: 12/23/2016 15:12    Microbiology: Recent Results (from the past 240 hour(s))  Culture, blood (Routine X 2) w Reflex to ID Panel     Status: None (Preliminary result)   Collection Time: 12/24/16  1:42 AM   Result Value Ref Range Status   Specimen Description BLOOD LEFT ANTECUBITAL  Final   Special Requests   Final    BOTTLES DRAWN AEROBIC AND ANAEROBIC Blood Culture adequate volume   Culture   Final    NO GROWTH 1 DAY Performed at [facility], [place] [address] [place], [place] [address]    Report Status PENDING  Incomplete  Culture, blood (Routine X 2) w Reflex to ID Panel     Status: None (Preliminary result)   Collection Time: 12/24/16  4:29 AM  Result Value Ref Range Status   Specimen Description BLOOD RIGHT ANTECUBITAL  Final   Special Requests IN PEDIATRIC BOTTLE Blood Culture adequate volume  Final   Culture   Final    NO GROWTH 1 DAY Performed at [facility], [place] [address], [place], [place] [address]    Report Status PENDING  Incomplete  Surgical pcr screen     Status: Abnormal   Collection Time: 12/24/16  9:25 PM  Result Value Ref Range Status   MRSA, PCR NEGATIVE NEGATIVE Final   Staphylococcus aureus POSITIVE (A) NEGATIVE Final    Comment:        The Xpert SA Assay (FDA approved for NASAL specimens in patients over 78 years of age), is one component of a comprehensive surveillance program.  Test performance has been validated by [facility] for patients greater than or equal to 76 year old. It is not intended to diagnose infection nor to guide  or monitor treatment.      Labs: Basic Metabolic Panel:  Recent Labs Lab 12/23/16 1548 12/23/16 1828 12/24/16 0429  NA 146* 143 143  K 4.7 3.9 3.6  CL 104 109 111  CO2 _0 GLUCOSE 181* 156* 164*  BUN _1 CREATININE 0.78 0.63 0.60  CALCIUM 9.8 9.0 8.3*  MG  --   --  1.8  PHOS  --   --  3.4   Liver Function Tests:  Recent Labs Lab 12/23/16 1548 12/23/16 1828 12/24/16 0429  AST 21 25 13*  ALT _2 ALKPHOS 69 57 45  BILITOT 0.4 0.6 0.7  PROT 7.6 7.6 6.3*  ALBUMIN 4.8 4.6 3.6    Recent Labs Lab 12/23/16 1548 12/23/16 1828 12/24/16 0429 12/25/16 0525  LIPASE 1,391*  667* 190* 28  AMYLASE 612*  --   --   --    No results for input(s): AMMONIA in the last 168 hours. CBC:  Recent Labs Lab 12/23/16 1543 12/23/16 1828 12/24/16 0429 12/25/16 0525  WBC 12.6* 14.2* 13.2* 14.3*  HGB 15.4 14.9 12.3 12.1  HCT 43.5 44.8 38.3 37.3  MCV 85.5 90.3 90.5 90.3  PLT  --  275 293 259    CBG:  Recent Labs Lab 12/25/16 2028 12/25/16 2358 12/26/16 0413 12/26/16 0751 12/26/16 1158  GLUCAP 163* 166* 154* 126* 112*       Signed:  Eleonore Chiquito S MD.  [facility] 12/26/2016, 1:41 PM

## 2016-12-26 NOTE — Discharge Instructions (Signed)
[facility] ______[facility] °LAPAROSCOPIC SURGERY: POST OP INSTRUCTIONS °Always review your discharge instruction sheet given to you by the facility where your surgery was performed. °IF YOU HAVE DISABILITY OR FAMILY LEAVE FORMS, YOU MUST BRING THEM TO THE OFFICE FOR PROCESSING.   °DO NOT GIVE THEM TO YOUR DOCTOR. ° °1. A prescription for pain medication may be given to you upon discharge.  Take your pain medication as prescribed, if needed.  If narcotic pain medicine is not needed, then you may take acetaminophen (Tylenol) or ibuprofen (Advil) as needed. °2. Take your usually prescribed medications unless otherwise directed. °3. If you need a refill on your pain medication, please contact your pharmacy.  They will contact our office to request authorization. Prescriptions will not be filled after 5pm or on week-ends. °4. You should follow a light diet the first few days after arrival home, such as soup and crackers, etc.  Be sure to include lots of fluids daily. °5. Most patients will experience some swelling and bruising in the area of the incisions.  Ice packs will help.  Swelling and bruising can take several days to resolve.  °6. It is common to experience some constipation if taking pain medication after surgery.  Increasing fluid intake and taking a stool softener (such as Colace) will usually help or prevent this problem from occurring.  A mild laxative (Milk of Magnesia or Miralax) should be taken according to package instructions if there are no bowel movements after 48 hours. °7. Unless discharge instructions indicate otherwise, you may remove your bandages 24-48 hours after surgery, and you may shower at that time.  You may have steri-strips (small skin tapes) in place directly over the incision.  These strips should be left on the skin for 7-10 days.  If your surgeon used skin glue on the incision, you may shower in 24 hours.  The glue will flake off over the next 2-3 weeks.  Any sutures or  staples will be removed at the office during your follow-up visit. °8. ACTIVITIES:  You may resume regular (light) daily activities beginning the next day--such as daily self-care, walking, climbing stairs--gradually increasing activities as tolerated.  You may have sexual intercourse when it is comfortable.  Refrain from any heavy lifting or straining until approved by your doctor. °a. You may drive when you are no longer taking prescription pain medication, you can comfortably wear a seatbelt, and you can safely maneuver your car and apply brakes. °b. RETURN TO WORK:  __________________________________________________________ °9. You should see your doctor in the office for a follow-up appointment approximately 2-3 weeks after your surgery.  Make sure that you call for this appointment within a day or two after you arrive home to insure a convenient appointment time. °10. OTHER INSTRUCTIONS: __________________________________________________________________________________________________________________________ __________________________________________________________________________________________________________________________ °WHEN TO CALL YOUR DOCTOR: °1. Fever over 101.0 °2. Inability to urinate °3. Continued bleeding from incision. °4. Increased pain, redness, or drainage from the incision. °5. Increasing abdominal pain ° °The clinic staff is available to answer your questions during regular business hours.  Please don’t hesitate to call and ask to speak to one of the nurses for clinical concerns.  If you have a medical emergency, go to the nearest emergency room or call 911.  A surgeon from [facility] is always on call at the hospital. °[address], [address], [place], [place]  [address] ? [address] [address], [place], [place]   [address] °[phone] ? [phone] ? FAX [phone] °Web site:   [email or web address] ° ° °Laparoscopic Cholecystectomy, Care After °This sheet  gives you information about how to care for yourself after your procedure. Your health care provider may also give you more specific instructions. If you have problems or questions, contact your health care provider. °What can I expect after the procedure? °After the procedure, it is common to have: °· Pain at your incision sites. You will be given medicines to control this pain. °· Mild nausea or vomiting. °· Bloating and possible shoulder pain from the air-like gas that was used during the procedure. ° °Follow these instructions at home: °Incision care ° °· Follow instructions from your health care provider about how to take care of your incisions. Make sure you: °? Wash your hands with soap and water before you change your bandage (dressing). If soap and water are not available, use hand sanitizer. °? Change your dressing as told by your health care provider. °? Leave stitches (sutures), skin glue, or adhesive strips in place. These skin closures may need to be in place for 2 weeks or longer. If adhesive strip edges start to loosen and curl up, you may trim the loose edges. Do not remove adhesive strips completely unless your health care provider tells you to do that. °· Do not take baths, swim, or use a hot tub until your health care provider approves. Ask your health care provider if you can take showers. You may only be allowed to take sponge baths for bathing. °· Check your incision area every day for signs of infection. Check for: °? More redness, swelling, or pain. °? More fluid or blood. °? Warmth. °? Pus or a bad smell. °Activity °· Do not drive or use heavy machinery while taking prescription pain medicine. °· Do not lift anything that is heavier than 10 lb (4.5 kg) until your health care provider approves. °· Do not play contact sports until your health care provider approves. °· Do not drive for 24 hours if you were given a medicine to help you relax (sedative). °· Rest as needed. Do not return to work  or school until your health care provider approves. °General instructions °· Take over-the-counter and prescription medicines only as told by your health care provider. °· To prevent or treat constipation while you are taking prescription pain medicine, your health care provider may recommend that you: °? Drink enough fluid to keep your urine clear or pale yellow. °? Take over-the-counter or prescription medicines. °? Eat foods that are high in fiber, such as fresh fruits and vegetables, whole grains, and beans. °? Limit foods that are high in fat and processed sugars, such as fried and sweet foods. °Contact a health care provider if: °· You develop a rash. °· You have more redness, swelling, or pain around your incisions. °· You have more fluid or blood coming from your incisions. °· Your incisions feel warm to the touch. °· You have pus or a bad smell coming from your incisions. °· You have a fever. °· One or more of your incisions breaks open. °Get help right away if: °· You have trouble breathing. °· You have chest pain. °· You have increasing pain in your shoulders. °· You faint or feel dizzy when you stand. °· You have severe pain in your abdomen. °· You have nausea or vomiting that lasts for more than one day. °· You have leg pain. °This information is not intended to replace advice given to you by your health care provider. Make sure you   discuss any questions you have with your health care provider. °Document Released: 06/27/2005 Document Revised: 01/16/2016 Document Reviewed: 12/14/2015 °Elsevier Interactive Patient Education © 2017 [employer] ° °

## 2016-12-26 NOTE — Anesthesia Postprocedure Evaluation (Signed)
Anesthesia Post Note  Patient: Kimberly Brown  Procedure(s) Performed: Procedure(s) (LRB): LAPAROSCOPIC CHOLECYSTECTOMY WITH INTRAOPERATIVE CHOLANGIOGRAM (N/A)     Anesthesia Post Evaluation  Last Vitals:  Vitals:   12/25/16 2031 12/26/16 0416  BP: 139/60 (!) 124/55  Pulse: 62 (!) 58  Resp: 18 18  Temp: 36.8 C 36.4 C    Last Pain:  Vitals:   12/26/16 0800  TempSrc:   PainSc: 2                  Aemon Koeller EDWARD

## 2016-12-26 NOTE — Progress Notes (Signed)
1 Day Post-Op    CC:  Abdominal pain  Subjective: Doing well after surgery.  Tolerating clears.  Up and walking some.    Objective: Vital signs in last 24 hours: Temp:  [97.6 F (36.4 C)-98.6 F (37 C)] 97.6 F (36.4 C) (06/18 0416) Pulse Rate:  [58-91] 58 (06/18 0416) Resp:  [13-20] 18 (06/18 0416) BP: (124-153)/(55-76) 124/55 (06/18 0416) SpO2:  [94 %-100 %] 94 % (06/18 0416) Last BM Date: 12/23/16 360 PO 1446 IV Urine 0 Afebrile, VSS NO labs today IOC 6/17:  Intraoperative cholangiogram demonstrates extrahepatic biliary ducts of unremarkable caliber, with no large filling defect identified. Free flow of contrast across the ampulla.  Intake/Output from previous day: 06/17 0701 - 06/18 0700 In: 1856.7 [P.O.:360; I.V.:1446.7; IV Piggyback:50] Out: 0  Intake/Output this shift: No intake/output data recorded.  General appearance: alert, cooperative and no distress Resp: clear to auscultation bilaterally GI: soft, sore after surgery.  Sites all look good.  Lab Results:   Recent Labs  12/24/16 0429 12/25/16 0525  WBC 13.2* 14.3*  HGB 12.3 12.1  HCT 38.3 37.3  PLT 293 259    BMET  Recent Labs  12/23/16 1828 12/24/16 0429  NA 143 143  K 3.9 3.6  CL 109 111  CO2 23 24  GLUCOSE 156* 164*  BUN 14 15  CREATININE 0.63 0.60  CALCIUM 9.0 8.3*   PT/INR No results for input(s): LABPROT, INR in the last 72 hours.   Recent Labs Lab 12/23/16 1548 12/23/16 1828 12/24/16 0429  AST 21 25 13*  ALT 20 22 16   ALKPHOS 69 57 45  BILITOT 0.4 0.6 0.7  PROT 7.6 7.6 6.3*  ALBUMIN 4.8 4.6 3.6     Lipase     Component Value Date/Time   LIPASE 28 12/25/2016 0525     Medications: . enoxaparin (LOVENOX) injection  40 mg Subcutaneous Q24H  . insulin aspart  0-9 Units Subcutaneous Q4H  . mupirocin ointment  1 application Nasal BID   . dextrose 5 % and 0.45 % NaCl with KCl 20 mEq/L 10 mL/hr at 12/26/16 0936  . piperacillin-tazobactam (ZOSYN)  IV 3.375 g  (12/26/16 0814)   Anti-infectives    Start     Dose/Rate Route Frequency Ordered Stop   12/24/16 0800  piperacillin-tazobactam (ZOSYN) IVPB 3.375 g     3.375 g 12.5 mL/hr over 240 Minutes Intravenous Every 8 hours 12/24/16 0240     12/24/16 0100  piperacillin-tazobactam (ZOSYN) IVPB 3.375 g     3.375 g 100 mL/hr over 30 Minutes Intravenous  Once 12/24/16 0052 12/24/16 0206   12/23/16 2145  cefTRIAXone (ROCEPHIN) 2 g in dextrose 5 % 50 mL IVPB     2 g 100 mL/hr over 30 Minutes Intravenous  Once 12/23/16 2137 12/23/16 2336      Assessment/Plan Cholecystitis, gallstone pancreatitis S/p Laparoscopic cholecystectomy with IOC, 3/38/32, Dr.Alicia Thomas Diabetes Mellitis Short PR interval Liver lesion FEN:  IV fluids/clear diet ID: Zosyn day 4 DVT:  Lovenox   Plan:  Advance diet, plain tylenol or Vicodin for pain.    She can go when tolerating diet and PO pain meds.  I will put follow up information in the AVS.  LOS: 2 days   Kimberly Brown,Kimberly Brown 12/26/2016 [phone]  Agree with above. Looks good, should go home later today.  Alphonsa Overall, MD, [facility] Pager: [phone] Office phone:  [phone]

## 2016-12-27 DIAGNOSIS — K8020 Calculus of gallbladder without cholecystitis without obstruction: Secondary | ICD-10-CM

## 2016-12-27 DIAGNOSIS — K859 Acute pancreatitis without necrosis or infection, unspecified: Secondary | ICD-10-CM

## 2016-12-27 LAB — BASIC METABOLIC PANEL
ANION GAP: 7 (ref 5–15)
BUN: 10 mg/dL (ref 6–20)
CALCIUM: 8.2 mg/dL — AB (ref 8.9–10.3)
CHLORIDE: 104 mmol/L (ref 101–111)
CO2: 26 mmol/L (ref 22–32)
CREATININE: 0.5 mg/dL (ref 0.44–1.00)
GFR calc non Af Amer: >60 mL/min (ref >60)
Glucose, Bld: 120 mg/dL — ABNORMAL HIGH (ref 65–99)
Potassium: 3.6 mmol/L (ref 3.5–5.1)
SODIUM: 137 mmol/L (ref 135–145)

## 2016-12-27 LAB — GLUCOSE, CAPILLARY
Glucose-Capillary: 104 mg/dL — ABNORMAL HIGH (ref 65–99)
Glucose-Capillary: 89 mg/dL (ref 65–99)
Glucose-Capillary: 104 mg/dL — ABNORMAL HIGH (ref 65–99)

## 2016-12-27 LAB — COMPREHENSIVE METABOLIC PANEL
ALBUMIN: 2.9 g/dL — AB (ref 3.5–5.0)
ALK PHOS: 51 U/L (ref 38–126)
ALT: 34 U/L (ref 14–54)
ANION GAP: 9 (ref 5–15)
AST: 22 U/L (ref 15–41)
BUN: 13 mg/dL (ref 6–20)
CALCIUM: 7.8 mg/dL — AB (ref 8.9–10.3)
CO2: 26 mmol/L (ref 22–32)
CREATININE: 0.48 mg/dL (ref 0.44–1.00)
Chloride: 106 mmol/L (ref 101–111)
GFR calc Af Amer: >60 mL/min (ref >60)
GFR calc non Af Amer: >60 mL/min (ref >60)
GLUCOSE: 94 mg/dL (ref 65–99)
Potassium: 2.8 mmol/L — ABNORMAL LOW (ref 3.5–5.1)
SODIUM: 141 mmol/L (ref 135–145)
Total Bilirubin: 0.7 mg/dL (ref 0.3–1.2)
Total Protein: 5.5 g/dL — ABNORMAL LOW (ref 6.5–8.1)

## 2016-12-27 LAB — LIPASE, BLOOD: Lipase: 28 U/L (ref 11–51)

## 2016-12-27 MED ORDER — POTASSIUM CHLORIDE 10 MEQ/100ML IV SOLN
10.0000 meq | INTRAVENOUS | Status: AC
  Administered 2016-12-27 (×2): 10 meq via INTRAVENOUS
  Filled 2016-12-27 (×2): qty 100

## 2016-12-27 MED ORDER — PROMETHAZINE HCL 25 MG PO TABS: 12.5000 mg | ORAL_TABLET | Freq: Four times a day (QID) | ORAL | Status: DC | PRN

## 2016-12-27 MED ORDER — PROMETHAZINE HCL 12.5 MG PO TABS: 12.5000 mg | ORAL_TABLET | Freq: Four times a day (QID) | ORAL | 0 refills | Status: DC | PRN

## 2016-12-27 MED ORDER — POTASSIUM CHLORIDE 10 MEQ/100ML IV SOLN
10.0000 meq | INTRAVENOUS | Status: DC
  Filled 2016-12-27 (×4): qty 100

## 2016-12-27 MED ORDER — SODIUM CHLORIDE 0.9 % IV SOLN: INTRAVENOUS | Status: DC

## 2016-12-27 MED ORDER — POTASSIUM CHLORIDE CRYS ER 20 MEQ PO TBCR
40.0000 meq | EXTENDED_RELEASE_TABLET | ORAL | Status: DC
  Administered 2016-12-27 (×2): 40 meq via ORAL
  Filled 2016-12-27 (×2): qty 2

## 2016-12-27 NOTE — Progress Notes (Signed)
Iv infiltrated-- IVT notified to restart IV--new orders noted. SRP, RN

## 2016-12-27 NOTE — Telephone Encounter (Signed)
Pt seen on 6/15 by Timmothy Euler

## 2016-12-27 NOTE — Progress Notes (Signed)
Initial Nutrition Assessment  INTERVENTION:   Provided examples of clear liquid protein options  Encourage PO intake as tolerated RD to continue to monitor  NUTRITION DIAGNOSIS:   Inadequate oral intake related to nausea, vomiting as evidenced by per patient/family report.  GOAL:   Patient will meet greater than or equal to 90% of their needs  MONITOR:   PO intake, Labs, Weight trends, I & O's  REASON FOR ASSESSMENT:   Malnutrition Screening Tool    ASSESSMENT:   66 y.o. female with medical history significant of DM2, HLD, abdominal hernia repair in 2005 6/17: s/p LAPAROSCOPIC CHOLECYSTECTOMY WITH INTRAOPERATIVE CHOLANGIOGRAM  Patient in room with no family at bedside. Per patient, she will discharge if K levels improve. Pt had some episodes of N/V yesterday after consuming solid food such as [place] and green beans. Pt states she thinks she advanced too soon. Now she is tolerating clears and MD has advised pt to continue clears for a few more days. Encouraged protein supplements during time on a primarily liquid diet. Provided examples of supplement options.   Per chart review, pt has lost 19 lb since 3/21 (10% wt loss x 3 months, significant for time frame). Nutrition focused physical exam shows no sign of depletion of muscle mass or body fat.   Medications reviewed.  Labs reviewed: CBGs: 89-104 Low K  Diet Order:  Diet - low sodium heart healthy Diet regular Room service appropriate? Yes; Fluid consistency: Thin  Skin:  Reviewed, no issues  Last BM:  6/15  Height:   Ht Readings from Last 1 Encounters:  12/24/16 5\' 4"  (1.626 m)    Weight:   Wt Readings from Last 1 Encounters:  12/24/16 170 lb 6.7 oz (77.3 kg)    Ideal Body Weight:  54.5 kg  BMI:  Body mass index is 29.25 kg/m.  Estimated Nutritional Needs:   Kcal:  1900-2100  Protein:  80-90g  Fluid:  2L/day  EDUCATION NEEDS:   Education needs addressed  Clayton Bibles, MS, RD, LDN Pager:  [phone] After Hours Pager: [phone]

## 2016-12-27 NOTE — Discharge Summary (Signed)
Physician Discharge Summary  Kimberly Brown OFB:[id number] DOB: July 06, 1951 DOA: 12/23/2016  PCP: Lisabeth Devoid, CRNA  Admit date: 12/23/2016 Discharge date: 12/27/2016  Time spent: 35 minutes  Recommendations for Outpatient Follow-up:  1.  Follow up general surgery in 1-2 weeks 1. Follow up PCP in 2 weeks, will need MRI of liver as outpatient for evaluation of liver lesion.    Discharge Diagnoses:  Active Problems:   Pancreatitis   Acute cholecystitis   DM (diabetes mellitus), type 2 ([place])   Type 2 diabetes mellitus with hyperlipidemia ([place])   Shortened PR interval   Discharge Condition: Stable  Diet recommendation: Regular diet  Filed Weights   12/24/16 0237  Weight: 77.3 kg (170 lb 6.7 oz)    History of present illness:  66 y.o.femalewith medical history significant of DM2, HLD, abdominal hernia repair in 2005 Presented with 1 day hx of chills and sweats epigastric abdominal pain. No Hx of alcohol use, no fatty foods. Pain was worse with eating. No prior hx of gallstones or gall bladder diseases.  No chest pain no shortness of breath.  She went to urgent care and was told she has pancreatitis due to elevated lipase up to 1000. She was instructed to come to ER  Hospital Course:  1. Gallstone pancreatitis- patient presented with rigid lipase 1391 at urgent care,  lipase camedown to 190. Patient started on carb modified diet. Patient vomited after she ate food. Called and discussed Dr. Lucia Gaskins who recommends that patient can be discharged on full liquid diet and take Phenergan  when necessary for nausea and vomiting.  2. Acute cholecystitis-ultrasound abdomen shows acute cholecystitis,patient underwent laparoscopic cholecystectomy. General surgery has requested to discharge home. Tolerating by mouth diet well. 3. Diabetes mellitus- patient is not on medications at home 4. Hypokalemia- potassium was 2.8, replaced potassium. Repeat potassium is 3.6 5. Short PR  interval-noted on EKG, admitting physician discussed with cardiology, no further cardiac workup 6. Liver lesion-CT scan shows 15 mm lesion in the posterior dome of the right liver. MRI recommended as outpatient for further evaluation.  Discharge was held yesterday, as she started having nausea, vomiting. It has now resolved, will discharge home on prn Phenergan.  Procedures: Laparoscopic cholecystectomy with intraoperative cholangiogram    Consultations:  General surgery  Discharge Exam: Vitals:   12/27/16 0437 12/27/16 1311  BP: (!) 153/71 129/73  Pulse: 66 81  Resp: 18 18  Temp: 98.4 F (36.9 C) 98.4 F (36.9 C)    General: Appears in no acute distress Cardiovascular: S1S2, Regular Respiratory: Clear bilaterally  Discharge Instructions   Discharge Instructions    Diet - low sodium heart healthy    Complete by:  As directed    Discharge instructions    Complete by:  As directed    You can take Zofran 4 mg every 8 hours as needed for nausea and vomiting.   Increase activity slowly    Complete by:  As directed      Current Discharge Medication List    START taking these medications   Details  HYDROcodone-acetaminophen (NORCO/VICODIN) 5-325 MG tablet Take 1 tablet by mouth every 4 (four) hours as needed for moderate pain or severe pain. Qty: 30 tablet, Refills: 0    promethazine (PHENERGAN) 12.5 MG tablet Take 1 tablet (12.5 mg total) by mouth every 6 (six) hours as needed for nausea or vomiting. Qty: 15 tablet, Refills: 0      CONTINUE these medications which have NOT CHANGED  Details  glucosamine-chondroitin 500-400 MG tablet Take 1 tablet by mouth 3 (three) times daily.    blood glucose meter kit and supplies KIT Dispense based on patient and insurance preference. Use up to four times daily as directed. (FOR ICD-10 E11.9). Qty: 1 each, Refills: 0   Associated Diagnoses: Type 2 diabetes mellitus without complication, without long-term current use of  insulin ([place])       No Known Allergies Follow-up Information    Sur[facility], [facility] Follow up on 01/10/2017.   Specialty:  General Surgery Why:  Your appointment is at 10 AM, be at the office 30 minutes early for check in.  Bring photo ID and insurance information.   Contact information: [address] [place] [address] [phone]            The results of significant diagnostics from this hospitalization (including imaging, microbiology, ancillary and laboratory) are listed below for reference.    Significant Diagnostic Studies: Dg Cholangiogram Operative  Result Date: 12/25/2016 CLINICAL DATA:  66 year old female with a history of cholelithiasis EXAM: INTRAOPERATIVE CHOLANGIOGRAM TECHNIQUE: Cholangiographic images from the C-arm fluoroscopic device were submitted for interpretation post-operatively. Please see the procedural report for the amount of contrast and the fluoroscopy time utilized. FLUOROSCOPY TIME:  Fluoroscopy Time:  8 seconds COMPARISON:  CT 12/24/2016 FINDINGS: Surgical instruments project over the upper abdomen. There is cannulation of the cystic duct/gallbladder neck, with antegrade infusion of contrast. Caliber of the extrahepatic ductal system within normal limits. No large filling defect identified. Free flow of contrast across the ampulla. IMPRESSION: Intraoperative cholangiogram demonstrates extrahepatic biliary ducts of unremarkable caliber, with no large filling defect identified. Free flow of contrast across the ampulla. Please refer to the dictated operative report for full details of intraoperative findings and procedure Electronically Signed   By: Corrie Mckusick D.O.   On: 12/25/2016 14:27   Ct Abdomen Pelvis W Contrast  Result Date: 12/24/2016 CLINICAL DATA:  Abdominal pain with multiple episodes of vomiting EXAM: CT ABDOMEN AND PELVIS WITH CONTRAST TECHNIQUE: Multidetector CT imaging of the abdomen and pelvis was performed using the  standard protocol following bolus administration of intravenous contrast. CONTRAST:  189m ISOVUE-300 IOPAMIDOL (ISOVUE-300) INJECTION 61% COMPARISON:  12/23/2016, 06/21/2016 FINDINGS: Lower chest: Lung bases demonstrate no acute consolidation or effusion. Normal heart size. Hepatobiliary: Fatty liver. Focal 15 mm hyper enhancement within the posterior dome of the right lobe with surrounding relative increased density of the liver parenchyma. Additional areas of increased density near the gallbladder fossa possibly due to fatty sparing. No biliary dilatation. Mild enhancement of the gallbladder wall with mild wall thickening. Pancreas: Mild soft tissue stranding at the distal body and tail of pancreas with small fluid in the anterior pararenal space suspicious for mild pancreatitis. No ductal dilatation. Spleen: Normal in size without focal abnormality. Adrenals/Urinary Tract: Adrenal glands within normal limits. subcentimeter cortical hypodense lesions in the kidneys too small to further characterize. Stable cyst lower pole left kidney. Bladder normal Stomach/Bowel: Stomach is within normal limits. Appendix appears normal. No evidence of bowel wall thickening, distention, or inflammatory changes. Vascular/Lymphatic: No significant vascular findings are present. No enlarged abdominal or pelvic lymph nodes. Reproductive: Status post hysterectomy. No adnexal masses. Other: No free air. Musculoskeletal: None IMPRESSION: 1. Gallbladder wall enhancement with surrounding mild edema or gallbladder wall thickening, findings are suspect for mild acute cholecystitis as noted on prior ultrasound. No biliary dilatation. 2. There is soft tissue stranding around the distal body and tail of the  pancreas with fluid in the left anterior pararenal space, suspicious for acute pancreatitis. 3. Focal 15 mm hyperenhancing lesion in the posterior dome of the right liver. Underlying liver density appears fatty with hyperdense areas near  the gallbladder fossa and posterior dome of the liver possibly due to fatty sparing. Recommend nonemergent MRI for further evaluation. Electronically Signed   By: Donavan Foil M.D.   On: 12/24/2016 02:26   US Abdomen Limited  Result Date: 12/23/2016 CLINICAL DATA:  Upper abdominal pain and increased lipase. Pancreatitis. EXAM: ULTRASOUND ABDOMEN LIMITED RIGHT UPPER QUADRANT COMPARISON:  None. FINDINGS: Gallbladder: The gallbladder is normally distended. There is marked edema of the gallbladder wall measuring up to 7 mm. Echogenic foci within the gallbladder may represent gallbladder stones/sludge. The largest gallstone measures 8 mm. Sonographic Murphy's sign was reported as negative. Common bile duct: Diameter: 4.7 mm in maximum transverse dimension. Liver: No focal lesion identified. Within normal limits in parenchymal echogenicity. IMPRESSION: Marked gallbladder wall edema with thickening of up to 7 mm, findings usually associated with acute cholecystitis. Cholelithiasis. Sonographic Murphy's sign was reported as negative. Was the patient given pain medications prior to her ultrasound exam? Electronically Signed   By: Fidela Salisbury M.D.   On: 12/23/2016 21:43   Dg Abd 2 Views  Result Date: 12/23/2016 CLINICAL DATA:  66 year old female with abdominal pain distention nausea and vomiting for 1 day. EXAM: ABDOMEN - 2 VIEW COMPARISON:  CT Abdomen and Pelvis 06/21/2016 and earlier. FINDINGS: Upright and supine views of the abdomen and pelvis. Negative lung bases. No pneumoperitoneum. Retained stool in the colon. There is mild to moderate gaseous distention of the ascending colon and hepatic flexure, with a paucity of bowel gas elsewhere. Still, there is no dilated small bowel. No right colon abnormality was identified at the time of the CT in December. Evidence of decompressed small bowel loops in the left and right lower abdomen. There is some gas in the descending colon and sigmoid. No acute osseous  abnormality identified. Other abdominal and pelvic visceral contours are within normal limits. IMPRESSION: Conspicuous gaseous distension of the right colon, but no other evidence of acute bowel obstruction and no free air. There is retained stool in the more distal colon. Electronically Signed   By: Genevie Ann M.D.   On: 12/23/2016 15:12    Microbiology: Recent Results (from the past 240 hour(s))  Culture, blood (Routine X 2) w Reflex to ID Panel     Status: None (Preliminary result)   Collection Time: 12/24/16  1:42 AM  Result Value Ref Range Status   Specimen Description BLOOD LEFT ANTECUBITAL  Final   Special Requests   Final    BOTTLES DRAWN AEROBIC AND ANAEROBIC Blood Culture adequate volume   Culture   Final    NO GROWTH 3 DAYS Performed at [facility], [address], [place], [place] [address]    Report Status PENDING  Incomplete  Culture, blood (Routine X 2) w Reflex to ID Panel     Status: None (Preliminary result)   Collection Time: 12/24/16  4:29 AM  Result Value Ref Range Status   Specimen Description BLOOD RIGHT ANTECUBITAL  Final   Special Requests IN PEDIATRIC BOTTLE Blood Culture adequate volume  Final   Culture   Final    NO GROWTH 3 DAYS Performed at [facility], [place] [address] [place], [place] [address]    Report Status PENDING  Incomplete  Surgical pcr screen  Status: Abnormal   Collection Time: 12/24/16  9:25 PM  Result Value Ref Range Status   MRSA, PCR NEGATIVE NEGATIVE Final   Staphylococcus aureus POSITIVE (A) NEGATIVE Final    Comment:        The Xpert SA Assay (FDA approved for NASAL specimens in patients over 65 years of age), is one component of a comprehensive surveillance program.  Test performance has been validated by [facility] for patients greater than or equal to 18 year old. It is not intended to diagnose infection nor to guide or monitor treatment.      Labs: Basic Metabolic Panel:  Recent Labs Lab  12/23/16 1548 12/23/16 1828 12/24/16 0429 12/27/16 0647 12/27/16 1448  NA 146* 143 143 141 137  K 4.7 3.9 3.6 2.8* 3.6  CL 104 109 111 106 104  CO2 25 23 24 26 26   GLUCOSE 181* 156* 164* 94 120*  BUN 14 14 15 13 10   CREATININE 0.78 0.63 0.60 0.48 0.50  CALCIUM 9.8 9.0 8.3* 7.8* 8.2*  MG  --   --  1.8  --   --   PHOS  --   --  3.4  --   --    Liver Function Tests:  Recent Labs Lab 12/23/16 1548 12/23/16 1828 12/24/16 0429 12/27/16 0647  AST 21 25 13* 22  ALT 20 22 16  34  ALKPHOS 69 57 45 51  BILITOT 0.4 0.6 0.7 0.7  PROT 7.6 7.6 6.3* 5.5*  ALBUMIN 4.8 4.6 3.6 2.9*    Recent Labs Lab 12/23/16 1548 12/23/16 1828 12/24/16 0429 12/25/16 0525 12/27/16 0647  LIPASE 1,391* 667* 190* 28 28  AMYLASE 612*  --   --   --   --    No results for input(s): AMMONIA in the last 168 hours. CBC:  Recent Labs Lab 12/23/16 1543 12/23/16 1828 12/24/16 0429 12/25/16 0525  WBC 12.6* 14.2* 13.2* 14.3*  HGB 15.4 14.9 12.3 12.1  HCT 43.5 44.8 38.3 37.3  MCV 85.5 90.3 90.5 90.3  PLT  --  275 293 259    CBG:  Recent Labs Lab 12/26/16 2038 12/26/16 2342 12/27/16 0435 12/27/16 0751 12/27/16 1308  GLUCAP 122* 102* 104* 89 104*       Signed:  Farrel Guimond S MD.  [facility] 12/27/2016, 4:10 PM

## 2016-12-27 NOTE — Progress Notes (Signed)
[facility] Progress Note  2 Days Post-Op  Subjective: CC: No complaints Patient had nausea and vomiting last night. No nausea currently, had phenergan last night which helped. Able to keep down clears this AM. Surgical pain is well controlled. +flatus, no BM. UOP good. VSS.   Objective: Vital signs in last 24 hours: Temp:  [98 F (36.7 C)-99.6 F (37.6 C)] 98.4 F (36.9 C) (06/19 0437) Pulse Rate:  [60-66] 66 (06/19 0437) Resp:  [16-18] 18 (06/19 0437) BP: (153-165)/(68-73) 153/71 (06/19 0437) SpO2:  [97 %-99 %] 98 % (06/19 0437) Last BM Date: 12/23/16  Intake/Output from previous day: 06/18 0701 - 06/19 0700 In: 2576.7 [P.O.:720; I.V.:1806.7; IV Piggyback:50] Out: 600 [Emesis/NG output:600] Intake/Output this shift: Total I/O In: 240 [P.O.:240] Out: -   PE: Gen:  Alert, NAD, pleasant Card:  Regular rate and rhythm, no M/G/R Pulm:  Normal effort, clear to auscultation bilaterally Abd: Soft, appropriately TTP, non-distended, bowel sounds present in all 4 quadrants, no HSM, incisions C/D/I Skin: warm and dry, no rashes  Psych: A&Ox3   Lab Results:   Recent Labs  12/25/16 0525  WBC 14.3*  HGB 12.1  HCT 37.3  PLT 259   BMET  Recent Labs  12/27/16 0647  NA 141  K 2.8*  CL 106  CO2 26  GLUCOSE 94  BUN 13  CREATININE 0.48  CALCIUM 7.8*   CMP     Component Value Date/Time   NA 141 12/27/2016 0647   NA 146 (H) 12/23/2016 1548   K 2.8 (L) 12/27/2016 0647   CL 106 12/27/2016 0647   CO2 26 12/27/2016 0647   GLUCOSE 94 12/27/2016 0647   BUN 13 12/27/2016 0647   BUN 14 12/23/2016 1548   CREATININE 0.48 12/27/2016 0647   CALCIUM 7.8 (L) 12/27/2016 0647   PROT 5.5 (L) 12/27/2016 0647   PROT 7.6 12/23/2016 1548   ALBUMIN 2.9 (L) 12/27/2016 0647   ALBUMIN 4.8 12/23/2016 1548   AST 22 12/27/2016 0647   ALT 34 12/27/2016 0647   ALKPHOS 51 12/27/2016 0647   BILITOT 0.7 12/27/2016 0647   BILITOT 0.4 12/23/2016 1548   GFRNONAA >60 12/27/2016  0647   GFRAA >60 12/27/2016 0647   Lipase     Component Value Date/Time   LIPASE 28 12/27/2016 0647    Studies/Results: Dg Cholangiogram Operative  Result Date: 12/25/2016 CLINICAL DATA:  66 year old female with a history of cholelithiasis EXAM: INTRAOPERATIVE CHOLANGIOGRAM TECHNIQUE: Cholangiographic images from the C-arm fluoroscopic device were submitted for interpretation post-operatively. Please see the procedural report for the amount of contrast and the fluoroscopy time utilized. FLUOROSCOPY TIME:  Fluoroscopy Time:  8 seconds COMPARISON:  CT 12/24/2016 FINDINGS: Surgical instruments project over the upper abdomen. There is cannulation of the cystic duct/gallbladder neck, with antegrade infusion of contrast. Caliber of the extrahepatic ductal system within normal limits. No large filling defect identified. Free flow of contrast across the ampulla. IMPRESSION: Intraoperative cholangiogram demonstrates extrahepatic biliary ducts of unremarkable caliber, with no large filling defect identified. Free flow of contrast across the ampulla. Please refer to the dictated operative report for full details of intraoperative findings and procedure Electronically Signed   By: Corrie Mckusick D.O.   On: 12/25/2016 14:27    Anti-infectives: Anti-infectives    Start     Dose/Rate Route Frequency Ordered Stop   12/24/16 0800  piperacillin-tazobactam (ZOSYN) IVPB 3.375 g  Status:  Discontinued     3.375 g 12.5 mL/hr over 240 Minutes Intravenous Every 8  hours 12/24/16 0240 12/26/16 1451   12/24/16 0100  piperacillin-tazobactam (ZOSYN) IVPB 3.375 g     3.375 g 100 mL/hr over 30 Minutes Intravenous  Once 12/24/16 0052 12/24/16 0206   12/23/16 2145  cefTRIAXone (ROCEPHIN) 2 g in dextrose 5 % 50 mL IVPB     2 g 100 mL/hr over 30 Minutes Intravenous  Once 12/23/16 2137 12/23/16 2336       Assessment/Plan Cholecystitis Gallstone Pancreatitis S/P laparoscopic cholecystectomy with IOC - 12/25/16 Dr.  Marcello Moores - discharge yesterday delayed due to nausea - no nausea currently - resolved with phenergan - stable for discharge today Hypokalemia - likely due to vomiting, replace  Diabetes Mellitus Short PR Interval Liver lesion  FEN - regular diet, replace potassium  VTE - lovenox ID - Zosyn (6/16> 6/18)  Plan: Stable for discharge home from a surgical standpoint. She will need to f/u in our office in 1-2 weeks.   LOS: 3 days    Brigid Re , [facility] Surgery 12/27/2016, 10:27 AM Pager: [phone] Consults: [phone]  Agree with above.  Alphonsa Overall, MD, [facility] Surgery Pager: [phone] Office phone:  [phone]

## 2016-12-27 NOTE — Progress Notes (Signed)
MD contacted to inform about KCL infusion. SRP, RN

## 2016-12-27 NOTE — Progress Notes (Signed)
Pt IV infiltrated and refused another stick, pt received 1 KCL bolus plus 1/2 bag of KCL bolus. Tol well. SRP, RN

## 2016-12-27 NOTE — Telephone Encounter (Signed)
FYI

## 2016-12-27 NOTE — Plan of Care (Signed)
Problem: Nutrition: Goal: Adequate nutrition will be maintained Outcome: Not Progressing Pt still experiencing nausea this shift.

## 2016-12-28 ENCOUNTER — Encounter (HOSPITAL_COMMUNITY): Payer: Self-pay | Admitting: General Surgery

## 2016-12-28 NOTE — Progress Notes (Signed)
Letter mailed with PA's notes to pt home

## 2016-12-29 LAB — CULTURE, BLOOD (ROUTINE X 2)
Culture: NO GROWTH
Culture: NO GROWTH
SPECIAL REQUESTS: ADEQUATE
SPECIAL REQUESTS: ADEQUATE

## 2017-01-16 ENCOUNTER — Ambulatory Visit (INDEPENDENT_AMBULATORY_CARE_PROVIDER_SITE_OTHER): Payer: BLUE CROSS/BLUE SHIELD | Admitting: Physician Assistant

## 2017-01-16 ENCOUNTER — Encounter: Payer: Self-pay | Admitting: Physician Assistant

## 2017-01-16 VITALS — BP 127/75 | HR 87 | Temp 98.1°F | Resp 18 | Ht 63.62 in | Wt 158.4 lb

## 2017-01-16 DIAGNOSIS — Z9049 Acquired absence of other specified parts of digestive tract: Secondary | ICD-10-CM

## 2017-01-16 DIAGNOSIS — R252 Cramp and spasm: Secondary | ICD-10-CM | POA: Diagnosis not present

## 2017-01-16 DIAGNOSIS — K769 Liver disease, unspecified: Secondary | ICD-10-CM

## 2017-01-16 NOTE — Progress Notes (Signed)
MRN: [id number] DOB: 10/09/1950  Subjective:   Kimberly Brown is a 66 y.o. female presenting for chief complaint of hospital follow up. Was originally evaluated by me in office on 12/23/16 for abdominal pain. Stat lipase returned at 1,391. Sent to ED for further evaluation. CT showed findings consistent with gallstone pancreatitis and incidental finding of 74m hyper enhancing lesion on posterior dome of right liver. Pt underwent laparoscopic cholecystectomy with intraoperative cholangiogram. Pt tolerated surgery well. Did have some vomiting after surgery. Was discharged on full liquid diet.  Potassium did decrease to 2.8 but was stable at 3.6 upon discharge. Incidental finding of short PR interval noted during hospital course. Admitting physician discussed with cardiology, who decided no further cardiac work up was warranted. Upon discharge, pt was scheduled to follow up with general surgery on 01/10/17 and her PCP one week after that. Recommended that an outpatient MRI of the liver be obtained.   Today, pt notes she followed up with general surgery as scheduled on 01/10/17. They checked her surgical incisions and released her. They did not obtain any blood work. Instructed her to follow up with PCP. She reports that she is feeling good. She has been having some lower leg cramps since the surgery and is concerned that it could be residual nerve damage from the surgery. She has some associated tingling sensation down both legs. Denies numbness, weakness, swelling, redness, and warmth. States the sensation/cramping occurs if she tries to stretch her legs out or if she sits in mediatation pose for too long. In terms of diet, she has been completely vegan x 3 weeks. She is taking a multivitamin. She is drinking lots of water daily. Has been having regular bowel movements. Her blood sugars have been controlled near 100.   In terms of liver lesion, she has no past medical hx of liver disease. She has not drank  alcohol in >22 years. No tylenol use. No abdominal pain, nausea, and vomiting. No FH of liver disease.    Review of Systems  Constitutional: Negative for chills, diaphoresis and fever.  Gastrointestinal: Negative for constipation and diarrhea.  Neurological: Negative for focal weakness.   EDeyjahhas a current medication list which includes the following prescription(s): blood glucose meter kit and supplies, glucosamine-chondroitin, hydrocodone-acetaminophen, and promethazine. Also has No Known Allergies.  Kimberly Brown has a past medical [place] and Hernia of abdominal cavity. Also  has a past surgical history that includes Abdominal hysterectomy; Excision basal cell carcinoma; and Cholecystectomy (N/A, 12/25/2016).   Objective:   Vitals: BP 127/75 (BP Location: Right Arm, Patient Position: Sitting, Cuff Size: Normal)   Pulse 87   Temp 98.1 F (36.7 C) (Oral)   Resp 18   Ht 5' 3.62" (1.616 m)   Wt 158 lb 6.4 oz (71.8 kg)   SpO2 98%   BMI 27.51 kg/m   Physical Exam  Constitutional: She is oriented to person, place, and time. She appears well-developed and well-nourished.  HENT:  Head: Normocephalic and atraumatic.  Eyes: Conjunctivae are normal.  Neck: Normal range of motion.  Cardiovascular: Normal rate, regular rhythm and normal heart sounds.   Pulmonary/Chest: Effort normal and breath sounds normal.  Abdominal: Soft. Normal appearance and bowel sounds are normal. There is no tenderness.  Well healing scars noted on abdomen s/p cholecystectomy. No surrounding erythema or purulent drainage noted.   Musculoskeletal:       Right lower leg: She exhibits no swelling.  Left lower leg: She exhibits no swelling.  Neurological: She is alert and oriented to person, place, and time. She has normal strength. No cranial nerve deficit or sensory deficit.  Reflex Scores:      Tricep reflexes are 2+ on the right side and 2+ on the left side.       Bicep reflexes are 2+ on the right side and 2+ on the left side.      Brachioradialis reflexes are 2+ on the right side and 2+ on the left side.      Patellar reflexes are 2+ on the right side and 2+ on the left side.      Achilles reflexes are 2+ on the right side and 2+ on the left side. Skin: Skin is warm and dry.  Psychiatric: She has a normal mood and affect.  Vitals reviewed.   No results found for this or any previous visit (from the past 24 hour(s)).  Assessment and Plan :  1. Leg cramps Labs pending. Instructed to continue hydrating with at least 64 oz of water daily. Continue stretching. Return if symptoms persist or worsen.  - CBC with Differential/Platelet - CMP14+EGFR - Vitamin B12 - VITAMIN D 25 Hydroxy (Vit-D Deficiency, Fractures)  2. Liver lesion - Hepatitis panel, acute - MR LIVER W WO CONTRAST; Future  3. Hx of cholecystectomy Doing well. Labs pending. Follow up as needed.  - CBC with Differential/Platelet - CMP14+EGFR  Tenna Delaine, PA-C  [facility] [place] 01/18/2017 8:36 AM

## 2017-01-16 NOTE — Patient Instructions (Addendum)
We should have the results of your lab work in the next week. In terms of MRI, they should call you in the next 1-2 weeks to schedule that appointment. In terms of the leg cramps, there is some info below. If your symptoms persist, please let me know. Thank you for letting me participate in your health and well being.  Leg Cramps Leg cramps occur when a muscle or muscles tighten and you have no control over this tightening (involuntary muscle contraction). Muscle cramps can develop in any muscle, but the most common place is in the calf muscles of the leg. Those cramps can occur during exercise or when you are at rest. Leg cramps are painful, and they may last for a few seconds to a few minutes. Cramps may return several times before they finally stop. Usually, leg cramps are not caused by a serious medical problem. In many cases, the cause is not known. Some common causes include:  Overexertion.  Overuse from repetitive motions, or doing the same thing over and over.  Remaining in a certain position for a long period of time.  Improper preparation, form, or technique while performing a sport or an activity.  Dehydration.  Injury.  Side effects of some medicines.  Abnormally low levels of the salts and ions in your blood (electrolytes), especially potassium and calcium. These levels could be low if you are taking water pills (diuretics) or if you are pregnant.  Follow these instructions at home: Watch your condition for any changes. Taking the following actions may help to lessen any discomfort that you are feeling:  Stay well-hydrated. Drink enough fluid to keep your urine clear or pale yellow.  Try massaging, stretching, and relaxing the affected muscle. Do this for several minutes at a time.  For tight or tense muscles, use a warm towel, heating pad, or hot shower water directed to the affected area.  If you are sore or have pain after a cramp, applying ice to the affected area may  relieve discomfort. ? Put ice in a plastic bag. ? Place a towel between your skin and the bag. ? Leave the ice on for 20 minutes, 2-3 times per day.  Avoid strenuous exercise for several days if you have been having frequent leg cramps.  Make sure that your diet includes the essential minerals for your muscles to work normally.  Take medicines only as directed by your health care provider.  Contact a health care provider if:  Your leg cramps get more severe or more frequent, or they do not improve over time.  Your foot becomes cold, numb, or blue. This information is not intended to replace advice given to you by your health care provider. Make sure you discuss any questions you have with your health care provider. Document Released: 08/04/2004 Document Revised: 12/03/2015 Document Reviewed: 06/04/2014 Elsevier Interactive Patient Education  2018 [employer].     IF you received an x-ray today, you will receive an invoice from [facility]. Please contact [facility] at [phone] with questions or concerns regarding your invoice.   IF you received labwork today, you will receive an invoice from [place]. Please contact [facility] at [phone] with questions or concerns regarding your invoice.   Our billing staff will not be able to assist you with questions regarding bills from these companies.  You will be contacted with the lab results as soon as they are available. The fastest way to get your results is to activate your My Chart  account. Instructions are located on the last page of this paperwork. If you have not heard from [place] regarding the results in 2 weeks, please contact this office.

## 2017-01-17 LAB — HEPATITIS PANEL, ACUTE
HEP B C IGM: NEGATIVE
Hep A IgM: NEGATIVE
Hep C Virus Ab: <0.1 s/co ratio (ref 0.0–0.9)
Hepatitis B Surface Ag: NEGATIVE

## 2017-01-17 LAB — CMP14+EGFR
ALK PHOS: 58 IU/L (ref 39–117)
ALT: 13 IU/L (ref 0–32)
AST: 18 IU/L (ref 0–40)
Albumin/Globulin Ratio: 2 (ref 1.2–2.2)
Albumin: 4.5 g/dL (ref 3.6–4.8)
BUN/Creatinine Ratio: 12 (ref 12–28)
BUN: 8 mg/dL (ref 8–27)
Bilirubin Total: 0.5 mg/dL (ref 0.0–1.2)
CHLORIDE: 101 mmol/L (ref 96–106)
CO2: 22 mmol/L (ref 20–29)
CREATININE: 0.68 mg/dL (ref 0.57–1.00)
Calcium: 9.6 mg/dL (ref 8.7–10.3)
GFR calc Af Amer: 108 mL/min/{1.73_m2} (ref >59)
GFR calc non Af Amer: 93 mL/min/{1.73_m2} (ref >59)
Globulin, Total: 2.3 g/dL (ref 1.5–4.5)
Glucose: 84 mg/dL (ref 65–99)
Potassium: 4.3 mmol/L (ref 3.5–5.2)
SODIUM: 141 mmol/L (ref 134–144)
Total Protein: 6.8 g/dL (ref 6.0–8.5)

## 2017-01-17 LAB — CBC WITH DIFFERENTIAL/PLATELET
BASOS ABS: 0 10*3/uL (ref 0.0–0.2)
Basos: 1 %
EOS (ABSOLUTE): 0 10*3/uL (ref 0.0–0.4)
Eos: 1 %
HEMATOCRIT: 41.6 % (ref 34.0–46.6)
Hemoglobin: 13.8 g/dL (ref 11.1–15.9)
Immature Grans (Abs): 0 10*3/uL (ref 0.0–0.1)
Immature Granulocytes: 0 %
LYMPHS ABS: 1.4 10*3/uL (ref 0.7–3.1)
Lymphs: 32 %
MCH: 28.9 pg (ref 26.6–33.0)
MCHC: 33.2 g/dL (ref 31.5–35.7)
MCV: 87 fL (ref 79–97)
MONOS ABS: 0.4 10*3/uL (ref 0.1–0.9)
Monocytes: 8 %
Neutrophils Absolute: 2.5 10*3/uL (ref 1.4–7.0)
Neutrophils: 58 %
Platelets: 310 10*3/uL (ref 150–379)
RBC: 4.77 x10E6/uL (ref 3.77–5.28)
RDW: 13.6 % (ref 12.3–15.4)
WBC: 4.3 10*3/uL (ref 3.4–10.8)

## 2017-01-17 LAB — VITAMIN B12: VITAMIN B 12: 320 pg/mL (ref 232–1245)

## 2017-01-17 LAB — VITAMIN D 25 HYDROXY (VIT D DEFICIENCY, FRACTURES): VIT D 25 HYDROXY: 10.9 ng/mL — AB (ref 30.0–100.0)

## 2017-01-20 ENCOUNTER — Other Ambulatory Visit: Payer: Self-pay | Admitting: Physician Assistant

## 2017-01-20 MED ORDER — VITAMIN D (ERGOCALCIFEROL) 1.25 MG (50000 UNIT) PO CAPS: 50000.0000 [IU] | ORAL_CAPSULE | ORAL | 0 refills | Status: DC

## 2017-01-20 NOTE — Progress Notes (Signed)
Meds ordered this encounter  Medications  . Vitamin D, Ergocalciferol, (DRISDOL) 50000 units CAPS capsule    Sig: Take 1 capsule (50,000 Units total) by mouth every 7 (seven) days.    Dispense:  6 capsule    Refill:  0    Order Specific Question:   Supervising Provider    Answer:   Wardell Honour [[id number]]

## 2017-01-27 ENCOUNTER — Encounter: Payer: Self-pay | Admitting: Physician Assistant

## 2017-01-27 ENCOUNTER — Ambulatory Visit (INDEPENDENT_AMBULATORY_CARE_PROVIDER_SITE_OTHER): Payer: BLUE CROSS/BLUE SHIELD | Admitting: Physician Assistant

## 2017-01-27 VITALS — BP 119/76 | HR 92 | Temp 98.7°F | Resp 17 | Ht 64.0 in | Wt 198.0 lb

## 2017-01-27 DIAGNOSIS — R21 Rash and other nonspecific skin eruption: Secondary | ICD-10-CM

## 2017-01-27 MED ORDER — VALACYCLOVIR HCL 1 G PO TABS: 1000.0000 mg | ORAL_TABLET | Freq: Three times a day (TID) | ORAL | 0 refills | Status: DC

## 2017-01-27 NOTE — Progress Notes (Signed)
   Kimberly Brown  MRN: [id number] DOB: Dec 23, 1950  Subjective:   Kimberly Brown is a 66 y.o. female who presents for evaluation of a rash involving the trunk. Rash started 2 days ago. Lesions are red, and blistering in texture. Rash has changed over time. Rash is painful. Rates the pain a 5/10. Associated symptoms: tingling in the area prior to the rash appearing. Patient denies: abdominal pain, arthralgia, congestion, cough, crankiness, decrease in appetite, decrease in energy level, fever, headache, irritability, myalgia, nausea, sore throat and vomiting. Patient has not had contacts with similar rash. Patient has not had new exposures (soaps, lotions, laundry detergents, foods, medications, plants, insects or animals).  Review of Systems  Per HPI  Patient Active Problem List   Diagnosis Date Noted  . Pancreatitis 12/24/2016  . Acute cholecystitis 12/24/2016  . DM (diabetes mellitus), type 2 ([place]) 12/24/2016  . Type 2 diabetes mellitus with hyperlipidemia ([place]) 12/24/2016  . Shortened PR interval 12/24/2016  . Dizziness 09/28/2016  . Former smoker 09/28/2016    Current Outpatient Prescriptions on File Prior to Visit  Medication Sig Dispense Refill  . blood glucose meter kit and supplies KIT Dispense based on patient and insurance preference. Use up to four times daily as directed. (FOR ICD-10 E11.9). 1 each 0  . Vitamin D, Ergocalciferol, (DRISDOL) 50000 units CAPS capsule Take 1 capsule (50,000 Units total) by mouth every 7 (seven) days. 6 capsule 0   No current facility-administered medications on file prior to visit.     No Known Allergies   Objective:  BP 119/76   Pulse 92   Temp 98.7 F (37.1 C) (Oral)   Resp 17   Ht '5\' 4"'$  (1.626 m)   Wt 198 lb (89.8 kg)   SpO2 98%   BMI 33.99 kg/m   Physical Exam  Constitutional: She is oriented to person, place, and time and well-developed, well-nourished, and in no distress.  HENT:  Head: Normocephalic and atraumatic.    Eyes: Conjunctivae are normal.  Neck: Normal range of motion.  Pulmonary/Chest: Effort normal.  Neurological: She is alert and oriented to person, place, and time. Gait normal.  Skin: Skin is warm and dry. Rash noted. Rash is vesicular.     Psychiatric: Affect normal.  Vitals reviewed.   Assessment and Plan :  1. Rash and nonspecific skin eruption Hx and PE findings are consistent with herpes zoster. Will treat with antiviral at this time. Pt encouraged to use ibuprofen prn for pain. Return to clinic if symptoms worsen, do not improve, or as needed - valACYclovir (VALTREX) 1000 MG tablet; Take 1 tablet (1,000 mg total) by mouth 3 (three) times daily.  Dispense: 21 tablet; Refill: 0  Tenna Delaine, PA-C  [facility] 01/27/2017 11:52 AM

## 2017-01-27 NOTE — Patient Instructions (Addendum)
Your physical exam findings are consistent with shingles. Please take antiviral medication as prescribed with food and water. Return to clinic if symptoms worsen, do not improve in 7-10 days, or as needed.     IF you received an x-ray today, you will receive an invoice from [facility]. Please contact [facility] at [phone] with questions or concerns regarding your invoice.   IF you received labwork today, you will receive an invoice from [place] [place]. Please contact [facility] at [phone] with questions or concerns regarding your invoice.   Our billing staff will not be able to assist you with questions regarding bills from these companies.  You will be contacted with the lab results as soon as they are available. The fastest way to get your results is to activate your My Chart account. Instructions are located on the last page of this paperwork. If you have not heard from [place] regarding the results in 2 weeks, please contact this office.      Shingles Shingles is an infection that causes a painful skin rash and fluid-filled blisters. Shingles is caused by the same virus that causes chickenpox. Shingles only develops in people who:  Have had chickenpox.  Have gotten the chickenpox vaccine. (This is rare.)  The first symptoms of shingles may be itching, tingling, or pain in an area on your skin. A rash will follow in a few days or weeks. The rash is usually on one side of the body in a bandlike or beltlike pattern. Over time, the rash turns into fluid-filled blisters that break open, scab over, and dry up. Medicines may:  Help you manage pain.  Help you recover more quickly.  Help to prevent long-term problems.  Follow these instructions at home: Medicines  Take medicines only as told by your doctor.  Apply an anti-itch or numbing cream to the affected area as told by your doctor. Blister and Rash Care  Take a cool bath or put cool compresses on the area of  the rash or blisters as told by your doctor. This may help with pain and itching.  Keep your rash covered with a loose bandage (dressing). Wear loose-fitting clothing.  Keep your rash and blisters clean with mild soap and cool water or as told by your doctor.  Check your rash every day for signs of infection. These include redness, swelling, and pain that lasts or gets worse.  Do not pick your blisters.  Do not scratch your rash. General instructions  Rest as told by your doctor.  Keep all follow-up visits as told by your doctor. This is important.  Until your blisters scab over, your infection can cause chickenpox in people who have never had it or been vaccinated against it. To prevent this from happening, avoid touching other people or being around other people, especially: ? Babies. ? Pregnant women. ? Children who have eczema. ? Elderly people who have transplants. ? People who have chronic illnesses, such as leukemia or AIDS. Contact a doctor if:  Your pain does not get better with medicine.  Your pain does not get better after the rash heals.  Your rash looks infected. Signs of infection include: ? Redness. ? Swelling. ? Pain that lasts or gets worse. Get help right away if:  The rash is on your face or nose.  You have pain in your face, pain around your eye area, or loss of feeling on one side of your face.  You have ear pain or you have ringing in  your ear.  You have loss of taste.  Your condition gets worse. This information is not intended to replace advice given to you by your health care provider. Make sure you discuss any questions you have with your health care provider. Document Released: 12/14/2007 Document Revised: 02/21/2016 Document Reviewed: 04/08/2014 Elsevier Interactive Patient Education  [employer].

## 2017-02-03 ENCOUNTER — Encounter: Payer: Self-pay | Admitting: Neurology

## 2017-02-03 ENCOUNTER — Ambulatory Visit (INDEPENDENT_AMBULATORY_CARE_PROVIDER_SITE_OTHER): Payer: BLUE CROSS/BLUE SHIELD | Admitting: Neurology

## 2017-02-03 VITALS — BP 104/62 | HR 89 | Ht 63.75 in | Wt 156.0 lb

## 2017-02-03 DIAGNOSIS — R252 Cramp and spasm: Secondary | ICD-10-CM | POA: Diagnosis not present

## 2017-02-03 DIAGNOSIS — M5410 Radiculopathy, site unspecified: Secondary | ICD-10-CM
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
## 2017-02-03 NOTE — Patient Instructions (Signed)
Remember to drink plenty of fluid, eat healthy meals and do not skip any meals. Try to eat protein with a every meal and eat a healthy snack such as fruit or nuts in between meals. Try to keep a regular sleep-wake schedule and try to exercise daily, particularly in the form of walking, 20-30 minutes a day, if you can.   As far as diagnostic testing: Physical Therapy  Our phone number is [phone]. We also have an after hours call service for urgent matters and there is a physician on-call for urgent questions. For any emergencies you know to call 911 or go to the nearest emergency room

## 2017-02-08 ENCOUNTER — Ambulatory Visit: Payer: BLUE CROSS/BLUE SHIELD | Attending: Neurology | Admitting: Rehabilitative and Restorative Service Providers"

## 2017-02-08 DIAGNOSIS — M79604 Pain in right leg: Secondary | ICD-10-CM | POA: Diagnosis present

## 2017-02-08 DIAGNOSIS — R293 Abnormal posture: Secondary | ICD-10-CM | POA: Insufficient documentation

## 2017-02-08 DIAGNOSIS — M62838 Other muscle spasm: Secondary | ICD-10-CM | POA: Insufficient documentation

## 2017-02-08 NOTE — Patient Instructions (Signed)
EXERCISE SHOULD NOT KICK OFF A SPASM.  Supine: Leg Stretch With Strap (Basic)    Lie on back with one knee bent, foot flat on floor. Hook strap around other foot. Straighten knee. Keep knee level with other knee. Hold _30__ seconds. Relax leg completely down to floor.  Repeat _2-3__ times per session. Do _2__ sessions per day. THEN MOVE LEGS ACROSS YOUR BODY AND HOLD 30 SECONDS, THEN TO THE SIDE AND HOLD 30 SECONDS.  Copyright  VHI. All rights reserved.   Bridge    Lie back, legs bent. Inhale, pressing hips up. Keeping ribs in, lengthen lower back. Exhale, rolling down along spine from top. Repeat _10___ times. Do _2___ sessions per day.  http://pm.exer.[email or web address]   Copyright  VHI. All rights reserved.    Iliotibial Band Stretch    Stand with right hip near the wall. Cross other leg in front and use it and arm for support. Lean toward wall until a stretch is felt on outside of hip near wall. Keep that leg straight. Hold _30___ seconds. Repeat _3___ times. Do __2__ sessions per day.  [email or web address].[email or web address]   Copyright  VHI. All rights reserved.

## 2017-02-09 NOTE — Therapy (Signed)
[facility] [address] [place], [place], [address] Phone: [phone]   Fax:  [phone]  Physical Therapy Evaluation  Patient Details  Name: Kimberly Brown MRN: [id number] Date of Birth: May 04, 1951 Referring Provider: Heide Spark, MD  Encounter Date: 02/08/2017      PT End of Session - 02/09/17 1303    Visit Number 1   Number of Visits 6   Date for PT Re-Evaluation 03/26/17   Authorization Type private insurance   PT Start Time 1232   PT Stop Time 1322   PT Time Calculation (min) 50 min   Activity Tolerance Patient tolerated treatment well   Behavior During Therapy [facility] for tasks assessed/performed      Past Medical History:  Diagnosis Date  . Diabetes mellitus without complication ([place])   . Hernia of abdominal cavity   . Pancreatitis     Past Surgical History:  Procedure Laterality Date  . ABDOMINAL HYSTERECTOMY    . BASAL CELL CARCINOMA EXCISION    . CHOLECYSTECTOMY N/A 12/25/2016   Procedure: LAPAROSCOPIC CHOLECYSTECTOMY WITH INTRAOPERATIVE CHOLANGIOGRAM;  Surgeon: Leighton Ruff, MD;  Location: [facility];  Service: General;  Laterality: N/A;    There were no vitals filed for this visit.       Subjective Assessment - 02/08/17 1238    Subjective After recent surgery, the patient notes she woke one morning (12/28/16) and tried to cross her legs to meditate.  She notes that she felt an electric shock in right side, then noted her left arm and hand "clawed".  She reports she looked online to do nerve gliding exercises, rolled over and got another right side spasm.  She reports this happened 5 times in a row.  She is not rolling onto the right side, bending her legs (she sits in chair with legs extended to avoid spasms),.  She also notes every time R leg spasms, the L hand/foot "claw".    She notes sensory changes of right leg feeling like "I am standing in water" upon waking.  She walks every night for 30  minutes at slow pace, and then "paces" back and forth in morning.  She reports that she is wearing multiple compression hose at one time.    She is currently in a pattern of avoiding all movements that have previously brought on spasms as she feels this makes her go backwards each time.   Pertinent History Acute pancreatitis s/p surgery 6/17 (d/c home on 12/27/16).   34 lb weight loss in past 3-4 months.   Patient Stated Goals Reduce spasms.   Currently in Pain? No/denies            [facility] PT Assessment - 02/08/17 1245      Assessment   Medical Diagnosis muscle cramps, radiating LE pain   Referring Provider Heide Spark, MD   Onset Date/Surgical Date 12/28/16   Hand Dominance Right   Prior Therapy none     Precautions   Precautions Other (comment)   Precaution Comments spasms     Restrictions   Weight Bearing Restrictions No     Balance Screen   Has the patient fallen in the past 6 months No   Has the patient had a decrease in activity level because of a fear of falling?  Yes  modifying activities due to spasms   Is the patient reluctant to leave their home because of a fear of falling?  No     Home Environment  Living Environment Private residence   Living Arrangements Children  son resides with her   Type of [place] to enter   Entrance Stairs-Number of Steps 4   Entrance Stairs-Rails Can reach both   [place] Two level   Alternate Level Stairs-Number of Steps 12-15   Alternate Level Stairs-Rails Right   [place] None     Prior Function   Level of Independence Independent   Vocation Part time employment   Production assistant, radio; works from home     Cognition   Overall Cognitive Status Within Functional Limits for tasks assessed     Observation/Other Assessments   Focus on Therapeutic Outcomes (FOTO)  52%     Sensation   Additional Comments Notes sensation of "standing in water" worse in the morning.      Posture/Postural Control   Posture Comments Notes low back pain when standing or walking long periods.     ROM / Strength   AROM / PROM / Strength AROM;Strength     AROM   Overall AROM Comments Able to bring R and L knee to chest.     Strength   Overall Strength Within functional limits for tasks performed   Overall Strength Comments The patient is able to liftagainst gravity for SLR.   She denies muscle weakness.     Flexibility   Soft Tissue Assessment /Muscle Length yes   Hamstrings Tightness noted worse on the right side.     ITB significant pain with palpation of R ITB      Bed Mobility   Bed Mobility Rolling Right   Rolling Right --  patient avoids lying on her right side due to severe spasms     Ambulation/Gait   Ambulation/Gait Yes   Ambulation/Gait Assistance 7: Independent   Ambulation Distance (Feet) 200 Feet   Assistive device None   Gait Pattern Within Functional Limits            Objective measurements completed on examination: See above findings.                  PT Education - 02/09/17 1259    Education provided Yes   Education Details HEP: IT band stretch, hamstring stretch   Person(s) Educated Patient   Methods Explanation;Demonstration;Handout   Comprehension Verbalized understanding;Returned demonstration          PT Short Term Goals - 02/09/17 1304      PT SHORT TERM GOAL #1   Title The patient will return demo HEP for LE stretching.   Time 4   Period Weeks   Target Date 03/11/17     PT SHORT TERM GOAL #2   Title The patient will be able to sit in a chair with knees flexed to 90 degrees without muscle spasms.   Time 4   Period Weeks   Target Date 03/11/17           PT Long Term Goals - 02/09/17 1306      PT LONG TERM GOAL #1   Title The patient will be indep with home program for post d/c stretching and mobility.   Time 6   Period Weeks   Target Date 03/26/17     PT LONG TERM GOAL #2   Title The patient  will improve functional status score from 52% up to > or equal to 65%.   Time 6   Period Weeks   Target Date 03/26/17  PT LONG TERM GOAL #3   Title The patient will tolerate right sidelying without  muscle spasms.     Time 6   Period Weeks   Target Date 03/26/17                Plan - 02/09/17 1405    Clinical Impression Statement The patient is a 66 yo female presenting to OP rehab with recent onset of right LE muscle spasms.  At today's evaluation, she presents with limitations in body position and mobility due to deficits of tightness in ITB and hamstrings R side.  Patient also is avoiding functional activities and community outings due to fear of onset of spasms.  PT initiated treatment today and will develop to patient tolerance.   History and Personal Factors relevant to plan of care: fear avoidance, not sitting down for work activities, difficulty being in a car due to knee flexion setting off spasms,    Clinical Presentation Evolving   Clinical Decision Making Low   Rehab Potential Good   PT Frequency 1x / week   PT Duration 6 weeks   PT Treatment/Interventions ADLs/Self Care Home Management;Therapeutic exercise;Therapeutic activities;Manual techniques;Neuromuscular re-education;Patient/family education;Gait training;Functional mobility training;Cryotherapy;Moist Heat   PT Next Visit Plan Check HEP, soft tissue massage R IT band, general LE stretching, working on rolling and lying on R side to increase tolerance   Consulted and Agree with Plan of Care Patient      Patient will benefit from skilled therapeutic intervention in order to improve the following deficits and impairments:  Abnormal gait, Increased muscle spasms, Increased fascial restricitons, Decreased mobility, Impaired flexibility  Visit Diagnosis: Pain in right leg  Muscle spasm of right lower extremity  Abnormal posture     Problem List Patient Active Problem List   Diagnosis Date Noted  .  Pancreatitis 12/24/2016  . Acute cholecystitis 12/24/2016  . DM (diabetes mellitus), type 2 ([place]) 12/24/2016  . Type 2 diabetes mellitus with hyperlipidemia ([place]) 12/24/2016  . Shortened PR interval 12/24/2016  . Dizziness 09/28/2016  . Former smoker 09/28/2016    Rudell Cobb, PT 02/09/2017, 2:09 PM  [facility] [address] [place], [place], [address] Phone: [phone]   Fax:  [phone]  Name: Kimberly Brown MRN: [id number] Date of Birth: 1951-03-14

## 2017-02-16 ENCOUNTER — Ambulatory Visit: Payer: BLUE CROSS/BLUE SHIELD | Admitting: Rehabilitative and Restorative Service Providers"

## 2017-02-16 DIAGNOSIS — M62838 Other muscle spasm: Secondary | ICD-10-CM

## 2017-02-16 DIAGNOSIS — M79604 Pain in right leg: Secondary | ICD-10-CM

## 2017-02-16 DIAGNOSIS — R293 Abnormal posture: Secondary | ICD-10-CM

## 2017-02-16 NOTE — Patient Instructions (Signed)
EXERCISE SHOULD NOT KICK OFF A SPASM.  Supine: Leg Stretch With Strap (Basic)    Lie on back with one knee bent, foot flat on floor. Hook strap around other foot. Straighten knee. Keep knee level with other knee. Hold _30__ seconds. Relax leg completely down to floor.  Repeat _2-3__ times per session. Do _2__ sessions per day. THEN MOVE LEGS ACROSS YOUR BODY AND HOLD 30 SECONDS, THEN TO THE SIDE AND HOLD 30 SECONDS.  Copyright  VHI. All rights reserved.   Bridge    Lie back, legs bent. Inhale, pressing hips up. Keeping ribs in, lengthen lower back. Exhale, rolling down along spine from top. Repeat _10___ times. Do _2___ sessions per day.  http://pm.exer.us/55   Copyright  VHI. All rights reserved.    Iliotibial Band Stretch    Stand with right hip near the wall. Cross other leg in front and use it and arm for support. Lean toward wall until a stretch is felt on outside of hip near wall. Keep that leg straight. Hold _30___ seconds. Repeat _3___ times. Do __2__ sessions per day.  http://gt2.exer.us/355   Copyright  VHI. All rights reserved.      Electronically signed by Mervyn Gay, PT at 02/08/2017 1:22 PM   Straight Leg Raise    Tighten stomach and slowly raise locked right leg __8__ inches from floor. Repeat __10__ times per set. Do _2___ sets per session. Do __2__ sessions per day.  http://orth.exer.us/1103   Copyright  VHI. All rights reserved.   ABDUCTION: Side-Lying (Active)     NO WEIGHT. Lie on left side, top leg straight. Raise top leg as far as possible. Complete _2__ sets of __10_ repetitions. Perform _2__ sessions per day.  http://gtsc.exer.us/95   Copyright  VHI. All rights reserved.

## 2017-02-17 NOTE — Therapy (Signed)
[facility] [address] [place], [place], [address] Phone: [phone]   Fax:  [phone]  Physical Therapy Treatment  Patient Details  Name: Kimberly Brown MRN: [id number] Date of Birth: May 27, 1951 Referring Provider: Heide Spark, MD  Encounter Date: 02/16/2017      PT End of Session - 02/17/17 0834    Visit Number 2   Number of Visits 6   Date for PT Re-Evaluation 03/26/17   Authorization Type private insurance   PT Start Time 0800   PT Stop Time 7893   PT Time Calculation (min) 54 min   Activity Tolerance Patient tolerated treatment well   Behavior During Therapy [facility] for tasks assessed/performed      Past Medical History:  Diagnosis Date  . Diabetes mellitus without complication ([place])   . Hernia of abdominal cavity   . Pancreatitis     Past Surgical History:  Procedure Laterality Date  . ABDOMINAL HYSTERECTOMY    . BASAL CELL CARCINOMA EXCISION    . CHOLECYSTECTOMY N/A 12/25/2016   Procedure: LAPAROSCOPIC CHOLECYSTECTOMY WITH INTRAOPERATIVE CHOLANGIOGRAM;  Surgeon: Leighton Ruff, MD;  [facility];  Service: General;  Laterality: N/A;    There were no vitals filed for this visit.      Subjective Assessment - 02/16/17 0801    Subjective The patient notes symptoms bother her every day, but it is worse in the morning.  The exercises and walking help make symptoms "die down" until she sits with legs elevated.  She reports she changes positions all day trying to get comfortable.  She tried a rolling pin on ITB and began massaging her ITB.  She notes pain with pressure.  "The weird ting is, the left foot tingles", also notes distal thigh is numb around knee.     Pertinent History Acute pancreatitis s/p surgery 6/17 (d/c home on 12/27/16).   34 lb weight loss in past 3-4 months.   Patient Stated Goals Reduce spasms.   Currently in Pain? No/denies  "not pain, just a weird feeling with numbing and  tingling sensation."                         [facility] Adult PT Treatment/Exercise - 02/16/17 0828      Self-Care   Self-Care Other Self-Care Comments   Other Self-Care Comments  PT and patient discussed change in activities in 2018 due to health issues.  Discussed slowly increasing activity to be able to return to prior level of functioning with regular walking program.     Exercises   Exercises Knee/Hip     Knee/Hip Exercises: Stretches   Active Hamstring Stretch Right;30 seconds   Active Hamstring Stretch Limitations Also performed contract/relax for hamstring stretching.   Hip Flexor Stretch Right;Left;30 seconds;2 reps   Hip Flexor Stretch Limitations with passive overpressure   ITB Stretch Right;2 reps;30 seconds   ITB Stretch Limitations Standing stretch     Knee/Hip Exercises: Supine   Bridges --  reviewed from HEP   Straight Leg Raises Strengthening;Right;Left;10 reps  with tactile cues for quad set   Straight Leg Raises Limitations Educated to move to mid range with cues to avoid liftting too high     Knee/Hip Exercises: Sidelying   Hip ABduction Strengthening;Right;Left;10 reps   Hip ABduction Limitations With cues on hip position.  *Patient apprehensive about rolling onto the right side, however no spasm today.     Manual Therapy   Manual Therapy  Soft tissue mobilization;Myofascial release   Manual therapy comments Patient positioned in L sidelying with pillow between knees; part of manual provided with R knee flexed and then extended.   Soft tissue mobilization Trigger point release and general soft tissue massage of Right IT band with special emphasis on where anterior border of ITB meets quad at mid to upper thigh.                   PT Education - 02/17/17 [phone]    Education provided Yes   Education Details HEP: SLR and hip abduction added to HEP   Person(s) Educated Patient   Methods Explanation;Demonstration;Handout   Comprehension  Verbalized understanding;Returned demonstration          PT Short Term Goals - 02/09/17 1304      PT SHORT TERM GOAL #1   Title The patient will return demo HEP for LE stretching.   Time 4   Period Weeks   Target Date 03/11/17     PT SHORT TERM GOAL #2   Title The patient will be able to sit in a chair with knees flexed to 90 degrees without muscle spasms.   Time 4   Period Weeks   Target Date 03/11/17           PT Long Term Goals - 02/09/17 1306      PT LONG TERM GOAL #1   Title The patient will be indep with home program for post d/c stretching and mobility.   Time 6   Period Weeks   Target Date 03/26/17     PT LONG TERM GOAL #2   Title The patient will improve functional status score from 52% up to > or equal to 65%.   Time 6   Period Weeks   Target Date 03/26/17     PT LONG TERM GOAL #3   Title The patient will tolerate right sidelying without  muscle spasms.     Time 6   Period Weeks   Target Date 03/26/17               Plan - 02/17/17 0835    Clinical Impression Statement The patient has significant muscle tightness and trigger points in right IT band, especially anterior band with quadricep muscles (proximal).  PT recommends dry needling to help reduce muscle trigger points and spasms.  Will resend cert order to add to plan of care.    PT Treatment/Interventions ADLs/Self Care Home Management;Therapeutic exercise;Therapeutic activities;Manual techniques;Neuromuscular re-education;Patient/family education;Gait training;Functional mobility training;Cryotherapy;Moist Heat;Dry needling   PT Next Visit Plan Check HEP, soft tissue massage R IT band, dry needling R IT band, flexibility and hip/core strengthening.   Consulted and Agree with Plan of Care Patient      Patient will benefit from skilled therapeutic intervention in order to improve the following deficits and impairments:  Abnormal gait, Increased muscle spasms, Increased fascial restricitons,  Decreased mobility, Impaired flexibility  Visit Diagnosis: Pain in right leg  Muscle spasm of right lower extremity  Abnormal posture     Problem List Patient Active Problem List   Diagnosis Date Noted  . Pancreatitis 12/24/2016  . Acute cholecystitis 12/24/2016  . DM (diabetes mellitus), type 2 ([place]) 12/24/2016  . Type 2 diabetes mellitus with hyperlipidemia ([place]) 12/24/2016  . Shortened PR interval 12/24/2016  . Dizziness 09/28/2016  . Former smoker 09/28/2016    [place], PT 02/17/2017, 8:46 AM  [facility] [address] [place],  [place], [address] Phone: [phone]   Fax:  [phone]  Name: Kimberly Brown MRN: [id number] Date of Birth: [birth year]

## 2017-02-21 ENCOUNTER — Ambulatory Visit: Payer: BLUE CROSS/BLUE SHIELD | Admitting: Physical Therapy

## 2017-02-21 ENCOUNTER — Encounter: Payer: Self-pay | Admitting: Physical Therapy

## 2017-02-21 DIAGNOSIS — M79604 Pain in right leg: Secondary | ICD-10-CM | POA: Diagnosis not present

## 2017-02-21 DIAGNOSIS — M62838 Other muscle spasm: Secondary | ICD-10-CM

## 2017-02-21 DIAGNOSIS — R293 Abnormal posture: Secondary | ICD-10-CM

## 2017-02-21 NOTE — Therapy (Signed)
[facility] [place], [place], [address] Phone: [phone]   Fax:  [phone]  Physical Therapy Treatment  Patient Details  Name: Kimberly Brown MRN: [id number] Date of Birth: [birth year] Referring Provider: Heide Spark, MD  Encounter Date: 02/21/2017      PT End of Session - 02/21/17 0955    Visit Number 3   Number of Visits 6   Date for PT Re-Evaluation 03/26/17   Authorization Type private insurance   PT Start Time 0800   PT Stop Time 0845   PT Time Calculation (min) 45 min   Activity Tolerance Patient tolerated treatment well   Behavior During Therapy [facility] for tasks assessed/performed      Past Medical History:  Diagnosis Date  . Diabetes mellitus without complication ([place])   . Hernia of abdominal cavity   . Pancreatitis     Past Surgical History:  Procedure Laterality Date  . ABDOMINAL HYSTERECTOMY    . BASAL CELL CARCINOMA EXCISION    . CHOLECYSTECTOMY N/A 12/25/2016   Procedure: LAPAROSCOPIC CHOLECYSTECTOMY WITH INTRAOPERATIVE CHOLANGIOGRAM;  Surgeon: Leighton Ruff, MD;  Location: [facility];  Service: General;  Laterality: N/A;    There were no vitals filed for this visit.      Subjective Assessment - 02/21/17 0944    Subjective Patient reports sympotoms have improved with exercises and stretches. She continues to have IT band tightness and pain. She also continues to have numbness in her knee.    Pertinent History Acute pancreatitis s/p surgery 6/17 (d/c home on 12/27/16).   34 lb weight loss in past 3-4 months.   Patient Stated Goals Reduce spasms.   Currently in Pain? No/denies                           Trigger Point Dry Needling - 02/21/17 1300    Consent Given? Yes   Education Handout Provided Yes   Muscles Treated Lower Body Quadriceps;Hamstring   Quadriceps Response Twitch response elicited   Hamstring Response Twitch response elicited              PT  Education - 02/21/17 0948    Education provided Yes   Education Details reviewed rationale and percuations with dry needling.    Person(s) Educated Patient   Methods Explanation;Demonstration;Handout   Comprehension Verbalized understanding;Returned demonstration          PT Short Term Goals - 02/21/17 0954      PT SHORT TERM GOAL #1   Title The patient will return demo HEP for LE stretching.   Time 4   Period Weeks   Status On-going     PT SHORT TERM GOAL #2   Title The patient will be able to sit in a chair with knees flexed to 90 degrees without muscle spasms.   Time 4   Period Weeks   Status On-going           PT Long Term Goals - 02/09/17 1306      PT LONG TERM GOAL #1   Title The patient will be indep with home program for post d/c stretching and mobility.   Time 6   Period Weeks   Target Date 03/26/17     PT LONG TERM GOAL #2   Title The patient will improve functional status score from 52% up to > or equal to 65%.   Time 6   Period Weeks   Target Date  03/26/17     PT LONG TERM GOAL #3   Title The patient will tolerate right sidelying without  muscle spasms.     Time 6   Period Weeks   Target Date 03/26/17               Plan - 02/21/17 0950    Clinical Impression Statement Patient tolerated dry needling well. Therapy explained needling soreness and other percaustions such as brusing with dry needling. The patient had a good twitch respose with all trials of needling. Patient needlied in three spots along her vastus lateralis and 1 spot in her semitendionsis. She reported some increased numbness in her knee at first but this had disapated at the end of the session. Sodt tissue mobilization pefromed with IASTYM tool to reduce trigger points and reduce post needle soreness.    Clinical Presentation Evolving   Clinical Decision Making Low   Rehab Potential Good   PT Frequency 2x / week   PT Duration 6 weeks   PT Treatment/Interventions ADLs/Self  Care Home Management;Therapeutic exercise;Therapeutic activities;Manual techniques;Neuromuscular re-education;Patient/family education;Gait training;Functional mobility training;Cryotherapy;Moist Heat;Dry needling   PT Next Visit Plan assess tolerance to nedling. continue to progress exercises and stretches as tolerated.    Consulted and Agree with Plan of Care Patient      Patient will benefit from skilled therapeutic intervention in order to improve the following deficits and impairments:  Abnormal gait, Increased muscle spasms, Increased fascial restricitons, Decreased mobility, Impaired flexibility  Visit Diagnosis: Pain in right leg  Muscle spasm of right lower extremity  Abnormal posture     Problem List Patient Active Problem List   Diagnosis Date Noted  . Pancreatitis 12/24/2016  . Acute cholecystitis 12/24/2016  . DM (diabetes mellitus), type 2 ([place]) 12/24/2016  . Type 2 diabetes mellitus with hyperlipidemia ([place]) 12/24/2016  . Shortened PR interval 12/24/2016  . Dizziness 09/28/2016  . Former smoker 09/28/2016    Carney Living PT DPT  02/21/2017, 1:04 PM  [facility] [address] [place], [place], [address] Phone: [phone]   Fax:  [phone]  Name: Kimberly Brown MRN: [id number] Date of Birth: 10-18-1950

## 2017-02-22 ENCOUNTER — Telehealth: Payer: Self-pay | Admitting: Physician Assistant

## 2017-02-22 DIAGNOSIS — K769 Liver disease, unspecified: Secondary | ICD-10-CM

## 2017-02-22 NOTE — Telephone Encounter (Signed)
You can contact [employer] to speed up the review process and possibly get the pt's approval before her appt so it is not canceled. Otherwise it is scheduled to close on 02/24/17, I'm just not sure if this will be before the pt's appt time or after. Thanks!

## 2017-02-22 NOTE — Telephone Encounter (Signed)
Thank you Kimberly Brown. So do I need to contact [employer]?

## 2017-02-22 NOTE — Telephone Encounter (Signed)
Anderson Malta from [facility] called stating that pt had been added on to their schedule for an MRI on 8/17 and needed prior auth through [employer]. I initiated prior auth with [employer] and it is under review and set to close on 02/24/17. [employer] can be contacted by the provider before this if desired at [phone]. Part of the description as to why it did not meet medical necessity was "MRI is indicated for evaluation of an indeterminate liver lesion for follow-up or surveillance at 3 to 6 months when certain imaging findings or risk factors are present. Further review is required." I will call [facility] to let them know this in case they need to reschedule. Thanks.

## 2017-02-24 ENCOUNTER — Ambulatory Visit: Payer: BLUE CROSS/BLUE SHIELD | Admitting: Rehabilitative and Restorative Service Providers"

## 2017-02-24 ENCOUNTER — Ambulatory Visit: Admission: RE | Admit: 2017-02-24 | Payer: BLUE CROSS/BLUE SHIELD | Source: Ambulatory Visit

## 2017-02-24 DIAGNOSIS — M79604 Pain in right leg: Secondary | ICD-10-CM | POA: Diagnosis not present

## 2017-02-24 DIAGNOSIS — R293 Abnormal posture: Secondary | ICD-10-CM

## 2017-02-24 DIAGNOSIS — M62838 Other muscle spasm: Secondary | ICD-10-CM

## 2017-02-24 NOTE — Therapy (Signed)
[facility] [address] [place], [place], [address] Phone: [phone]   Fax:  [phone]  Physical Therapy Treatment  Patient Details  Name: Kimberly Brown MRN: [id number] Date of Birth: 12-14-1950 Referring Provider: Heide Spark, MD  Encounter Date: 02/24/2017      PT End of Session - 02/24/17 1251    Visit Number 4   Number of Visits 6   Date for PT Re-Evaluation 03/26/17   Authorization Type private insurance   PT Start Time 1020   PT Stop Time 1100   PT Time Calculation (min) 40 min   Activity Tolerance Patient tolerated treatment well   Behavior During Therapy [facility] for tasks assessed/performed      Past Medical History:  Diagnosis Date  . Diabetes mellitus without complication ([place])   . Hernia of abdominal cavity   . Pancreatitis     Past Surgical History:  Procedure Laterality Date  . ABDOMINAL HYSTERECTOMY    . BASAL CELL CARCINOMA EXCISION    . CHOLECYSTECTOMY N/A 12/25/2016   Procedure: LAPAROSCOPIC CHOLECYSTECTOMY WITH INTRAOPERATIVE CHOLANGIOGRAM;  Surgeon: Leighton Ruff, MD;  Location: [facility];  Service: General;  Laterality: N/A;    There were no vitals filed for this visit.      Subjective Assessment - 02/24/17 1024    Subjective The patient notes tingling and numbness in the right knee and left foot when getting up.  She feels symtpoms are improving gradually.  She is now able to sit down but avoids bending knees beyond 90.  She gets worsening numbness in R lower leg when lying on R side (for hip abduction), rolling her R IT band.  Numbness clears with stretching.   No spasms, however she is avoiding crossing her legs in sitting for meditation.     Pertinent History Acute pancreatitis s/p surgery 6/17 (d/c home on 12/27/16).   34 lb weight loss in past 3-4 months.   Patient Stated Goals Reduce spasms.                         [facility] Adult PT Treatment/Exercise - 02/24/17  1252      Therapeutic Activites    Therapeutic Activities Other Therapeutic Activities   Other Therapeutic Activities Discussed symptoms upon rising in the morning of R lateral knee numbness and L foot numbness and think this may be related to sleeping position.  Discussed use of pillow under knees and between knees in sidelying.  Also discussed prior meditation practice with patient noting that she continues to avoid sitting cross legged.  PT demonstrated and patient return demo'd using blanket to raise hips, sitting in butterfly position, and using yoga blocks to raise hips as options to return to prior activity without numbness.  Also discussed return to community exercise including tai chi and getting shoewear for walking to increase speed/distance of current walks.   PT had patient demo chair sitting with knees positioned greater than 90 degrees without muscle spasms present.      Knee/Hip Exercises: Stretches   Other Knee/Hip Stretches Quadriped anterior/posterior rocking x 5 reps with 5 second holds with some discomfort noted L knee.   Other Knee/Hip Stretches Lateral trunk lengthening in child's pose modified pillow for comfort of knees (between legs and hips) with reaching right and left.   Butterfly stretch for LEs.  Piriformis stretch initially in supine and then in seated to perform prior to cross legged sitting to ensure tolerance.  PT Education - 02/24/17 1250    Education provided Yes   Education Details Added HEP for quadriped heel sitting to increase knee flexion (using pillow to modify) and instruction on sitting positions for meditation and modifying with yoga blocks/blankets   Person(s) Educated Patient   Methods Explanation;Demonstration;Handout   Comprehension Verbalized understanding;Returned demonstration          PT Short Term Goals - 02/24/17 1026      PT SHORT TERM GOAL #1   Title The patient will return demo HEP for LE stretching.   Time 4    Period Weeks   Status Achieved     PT SHORT TERM GOAL #2   Title The patient will be able to sit in a chair with knees flexed to 90 degrees without muscle spasms.   Baseline Avoids bending beyond 90 degrees.   Time 4   Period Weeks   Status Achieved           PT Long Term Goals - 02/09/17 1306      PT LONG TERM GOAL #1   Title The patient will be indep with home program for post d/c stretching and mobility.   Time 6   Period Weeks   Target Date 03/26/17     PT LONG TERM GOAL #2   Title The patient will improve functional status score from 52% up to > or equal to 65%.   Time 6   Period Weeks   Target Date 03/26/17     PT LONG TERM GOAL #3   Title The patient will tolerate right sidelying without  muscle spasms.     Time 6   Period Weeks   Target Date 03/26/17               Plan - 02/24/17 1259    Clinical Impression Statement The patient is continuing to improve with dry needling, stretching, and strengthening activities.  PT focused on return to prior activities and reduction of self limiting behaviors to avoid bending the knees, as patient is no longer experiencing muscle spasms.  PT focused on ways to modify activities to help encourage return to prior status.  With LE stretching, patient notes greater discomfort in L knee and we recommended performing activities bilaterally.     PT Treatment/Interventions ADLs/Self Care Home Management;Therapeutic exercise;Therapeutic activities;Manual techniques;Neuromuscular re-education;Patient/family education;Gait training;Functional mobility training;Cryotherapy;Moist Heat;Dry needling   PT Next Visit Plan Continue dry needling and soft tissue mobilization, progress exercise with emphasis on return to community activities (did tai chi in past), and meditation practice   Consulted and Agree with Plan of Care Patient      Patient will benefit from skilled therapeutic intervention in order to improve the following deficits  and impairments:  Abnormal gait, Increased muscle spasms, Increased fascial restricitons, Decreased mobility, Impaired flexibility  Visit Diagnosis: Pain in right leg  Muscle spasm of right lower extremity  Abnormal posture     Problem List Patient Active Problem List   Diagnosis Date Noted  . Pancreatitis 12/24/2016  . Acute cholecystitis 12/24/2016  . DM (diabetes mellitus), type 2 ([place]) 12/24/2016  . Type 2 diabetes mellitus with hyperlipidemia ([place]) 12/24/2016  . Shortened PR interval 12/24/2016  . Dizziness 09/28/2016  . Former smoker 09/28/2016    [place], [place] 02/24/2017, 1:01 PM  [facility] [address] [place], [place], [address] Phone: [phone]   Fax:  [phone]  Name: Kimberly Brown MRN: [id number] Date of Birth: 1950/10/08

## 2017-02-24 NOTE — Telephone Encounter (Signed)
Contacted [facility]. According to guidelines, she has to wait at least 3 months after the initial CT for the MRI. I have contacted pt and informed of this. I have also went ahead and placed the order for the MRI of the liver to be expected on 03/31/17.

## 2017-02-24 NOTE — Patient Instructions (Signed)
All Fours Shoulder Flexion / Extension    Place hands and knees shoulder-width apart, rock back and sit on legs holding 5-10 seconds, then rock forward over hands and forearms. CAN USE A PILLOW BETWEEN LEGS AND HIPS TO AVOID OVERSTRETCHING. Repeat __5__ times.  Do __2__ sessions per day.  http://cc.exer.us/60   Copyright  VHI. All rights reserved.   Meditation Pose    Sit with knees wide to sides, heels in front of crotch. Keeping back straight, rest hands on knees, palms up. Touch first finger to thumb. Hold for __20-30__ breaths. CAN USE A FOLDED BLANKET, BEACH TOWELS, OR YOGA BLOCKS TO RAISE YOUR HIPS TO GET COMFORTABLY INTO MEDITATIVE POSITION.  http://yg.exer.us/79   Copyright  VHI. All rights reserved.

## 2017-02-28 ENCOUNTER — Encounter: Payer: Self-pay | Admitting: Physical Therapy

## 2017-02-28 ENCOUNTER — Ambulatory Visit: Payer: BLUE CROSS/BLUE SHIELD | Admitting: Physical Therapy

## 2017-02-28 DIAGNOSIS — M79604 Pain in right leg: Secondary | ICD-10-CM | POA: Diagnosis not present

## 2017-02-28 DIAGNOSIS — R293 Abnormal posture: Secondary | ICD-10-CM

## 2017-02-28 DIAGNOSIS — M62838 Other muscle spasm: Secondary | ICD-10-CM

## 2017-02-28 NOTE — Therapy (Signed)
[facility] [place], [place], [address] Phone: [phone]   Fax:  [phone]  Physical Therapy Treatment  Patient Details  Name: Kimberly Brown MRN: [id number] Date of Birth: [birth year] Referring Provider: Heide Spark, MD  Encounter Date: 02/28/2017      PT End of Session - 02/28/17 0842    Visit Number 5   Number of Visits 6   Date for PT Re-Evaluation 03/26/17   Authorization Type private insurance   PT Start Time 0800   PT Stop Time 6160   PT Time Calculation (min) 44 min   Activity Tolerance Patient tolerated treatment well   Behavior During Therapy [facility] for tasks assessed/performed      Past Medical History:  Diagnosis Date  . Diabetes mellitus without complication ([place])   . Hernia of abdominal cavity   . Pancreatitis     Past Surgical History:  Procedure Laterality Date  . ABDOMINAL HYSTERECTOMY    . BASAL CELL CARCINOMA EXCISION    . CHOLECYSTECTOMY N/A 12/25/2016   Procedure: LAPAROSCOPIC CHOLECYSTECTOMY WITH INTRAOPERATIVE CHOLANGIOGRAM;  Surgeon: Leighton Ruff, MD;  Location: [facility];  Service: General;  Laterality: N/A;    There were no vitals filed for this visit.      Subjective Assessment - 02/28/17 0837    Subjective The patient reports some days it feels much better but she slept wrong last night and she is having some numbness in her knee. She reported no adverse responses to dry needling after last visit.    Pertinent History Acute pancreatitis s/p surgery 6/17 (d/c home on 12/27/16).   34 lb weight loss in past 3-4 months.   Patient Stated Goals Reduce spasms.   Currently in Pain? No/denies                         [facility] Adult PT Treatment/Exercise - 02/28/17 0001      Knee/Hip Exercises: Supine   Quad Sets Limitations 2x10 into ball    Other Supine Knee/Hip Exercises clam shells with light abdominal brace 2x10      Manual Therapy   Soft tissue  mobilization IASTYM to IT band and area neddled to reduce trigger points and psot needling soreness.           Trigger Point Dry Needling - 02/28/17 1432    Consent Given? Yes   Education Handout Provided Yes   Quadriceps Response Twitch response elicited;Palpable increased muscle length              PT Education - 02/28/17 0838    Education provided Yes   Education Details reviewed dry needling responses    Person(s) Educated Patient   Methods Explanation;Demonstration;Tactile cues   Comprehension Verbalized understanding;Returned demonstration          PT Short Term Goals - 02/24/17 1026      PT SHORT TERM GOAL #1   Title The patient will return demo HEP for LE stretching.   Time 4   Period Weeks   Status Achieved     PT SHORT TERM GOAL #2   Title The patient will be able to sit in a chair with knees flexed to 90 degrees without muscle spasms.   Baseline Avoids bending beyond 90 degrees.   Time 4   Period Weeks   Status Achieved           PT Long Term Goals - 02/09/17 1306  PT LONG TERM GOAL #1   Title The patient will be indep with home program for post d/c stretching and mobility.   Time 6   Period Weeks   Target Date 03/26/17     PT LONG TERM GOAL #2   Title The patient will improve functional status score from 52% up to > or equal to 65%.   Time 6   Period Weeks   Target Date 03/26/17     PT LONG TERM GOAL #3   Title The patient will tolerate right sidelying without  muscle spasms.     Time 6   Period Weeks   Target Date 03/26/17               Plan - 02/28/17 1432    Clinical Impression Statement Patient tolerated treatment well. Therapy placed a series of needles in the vastus lateralis. She had a good twitch response. Therapy also needled 2 other places in the vastus lateralis. The last spot she had a grreat tiwtch response. Therapy work on IT trainer to reduce post needle soreness and soft tissue workfor post  needle soreness. The patient may benefit from furhter dry needling. she only has 1 visit left. Extend POC if appropriate.    Clinical Presentation Evolving   Clinical Decision Making Low   Rehab Potential Good   PT Frequency 2x / week   PT Duration 6 weeks   PT Treatment/Interventions ADLs/Self Care Home Management;Therapeutic exercise;Therapeutic activities;Manual techniques;Neuromuscular re-education;Patient/family education;Gait training;Functional mobility training;Cryotherapy;Moist Heat;Dry needling   PT Next Visit Plan Continue dry needling and soft tissue mobilization, progress exercise with emphasis on return to community activities (did tai chi in past), and meditation practice   Consulted and Agree with Plan of Care Patient      Patient will benefit from skilled therapeutic intervention in order to improve the following deficits and impairments:  Abnormal gait, Increased muscle spasms, Increased fascial restricitons, Decreased mobility, Impaired flexibility  Visit Diagnosis: Pain in right leg  Muscle spasm of right lower extremity  Abnormal posture     Problem List Patient Active Problem List   Diagnosis Date Noted  . Pancreatitis 12/24/2016  . Acute cholecystitis 12/24/2016  . DM (diabetes mellitus), type 2 ([place]) 12/24/2016  . Type 2 diabetes mellitus with hyperlipidemia ([place]) 12/24/2016  . Shortened PR interval 12/24/2016  . Dizziness 09/28/2016  . Former smoker 09/28/2016    Carney Living PT DPT  02/28/2017, 2:36 PM  [facility] [address] [place], [place], [address] Phone: [phone]   Fax:  [phone]  Name: Kimberly Brown MRN: [id number] Date of Birth: 1950/09/05

## 2017-03-01 ENCOUNTER — Other Ambulatory Visit: Payer: BLUE CROSS/BLUE SHIELD

## 2017-03-08 ENCOUNTER — Ambulatory Visit: Payer: BLUE CROSS/BLUE SHIELD | Admitting: Rehabilitative and Restorative Service Providers"

## 2017-03-08 DIAGNOSIS — M79604 Pain in right leg: Secondary | ICD-10-CM | POA: Diagnosis not present

## 2017-03-08 DIAGNOSIS — R293 Abnormal posture: Secondary | ICD-10-CM

## 2017-03-08 DIAGNOSIS — M62838 Other muscle spasm: Secondary | ICD-10-CM

## 2017-03-08 NOTE — Therapy (Signed)
[facility] [address] [place], [place], [address] Phone: [phone]   Fax:  [phone]  Physical Therapy Treatment  Patient Details  Name: Kimberly Brown MRN: [id number] Date of Birth: 11-11-1950 Referring Provider: Heide Spark, MD  Encounter Date: 03/08/2017      PT End of Session - 03/08/17 2049    Visit Number 6   Number of Visits 14   Date for PT Re-Evaluation 04/07/17   Authorization Type private insurance   PT Start Time 1020   PT Stop Time 1058   PT Time Calculation (min) 38 min   Activity Tolerance Patient tolerated treatment well   Behavior During Therapy [facility] for tasks assessed/performed      Past Medical History:  Diagnosis Date  . Diabetes mellitus without complication ([place])   . Hernia of abdominal cavity   . Pancreatitis     Past Surgical History:  Procedure Laterality Date  . ABDOMINAL HYSTERECTOMY    . BASAL CELL CARCINOMA EXCISION    . CHOLECYSTECTOMY N/A 12/25/2016   Procedure: LAPAROSCOPIC CHOLECYSTECTOMY WITH INTRAOPERATIVE CHOLANGIOGRAM;  Surgeon: Leighton Ruff, MD;  Location: [facility];  Service: General;  Laterality: N/A;    There were no vitals filed for this visit.      Subjective Assessment - 03/08/17 1023    Subjective The patient notes that the exercises are helping and she notes significant improvement.  She is able to sit comfortably and is doing 2x/day exercises.  She discovered that her left foot/2nd toe is improving because she is stretching her feet regularly.   She still detects some discomfort/numbness when lying on the right side for exercise.  She is ordering a cushion to sit on for meditation to elevate her pelvis to accomplish good positioning.     Pertinent History Acute pancreatitis s/p surgery 6/17 (d/c home on 12/27/16).   34 lb weight loss in past 3-4 months.   Patient Stated Goals Reduce spasms.   Currently in Pain? No/denies                          [facility] Adult PT Treatment/Exercise - 03/08/17 1033      Self-Care   Self-Care Other Self-Care Comments   Other Self-Care Comments  Discussed # of exercises and whether or not we should reduce number by reviewing all activities.  The patient notes that she is in a routine and does not feel overwhelmed by number of exercise.  She obtained shoes through [employer] and has walked 3 nights in a row and is happy with shoewear.       Exercises   Exercises Other Exercises   Other Exercises  Patient demonstrated prior piriformis stretch that we have completed previously and showed moving knee into IR to make it more of IT band stretch.       Knee/Hip Exercises: Stretches   ITB Stretch Right  maintained in stretched position during manual techniques   ITB Stretch Limitations L sidelying R ITB stretch.     Manual Therapy   Manual Therapy Soft tissue mobilization;Myofascial release   Manual therapy comments Positioned with R IT band in stretched position   Soft tissue mobilization Trigger point release, superficial and deep tissue massage over R IT band and vastus lateralis at both proximal and distal trigger points.     Myofascial Release Along right IT band with sustained pressure.  PT Education - 03/08/17 2048    Education provided Yes   Education Details PT and patient discussed that current HEP still appropriate.  Plan to continue with current HEP and attend dry needling with PT @ [address] that can also progress HEP and therex as needed.   Person(s) Educated Patient   Methods Explanation   Comprehension Verbalized understanding          PT Short Term Goals - 02/24/17 1026      PT SHORT TERM GOAL #1   Title The patient will return demo HEP for LE stretching.   Time 4   Period Weeks   Status Achieved     PT SHORT TERM GOAL #2   Title The patient will be able to sit in a chair with knees flexed to 90 degrees without  muscle spasms.   Baseline Avoids bending beyond 90 degrees.   Time 4   Period Weeks   Status Achieved           PT Long Term Goals - 03/08/17 1037      PT LONG TERM GOAL #1   Title The patient will be indep with home program for post d/c stretching and mobility.   Baseline Patient has HEP, PT to continue to progress as indicated.   Time 4   Period Weeks   Status Revised   Target Date 04/07/17     PT LONG TERM GOAL #2   Title The patient will improve functional status score from 52% up to > or equal to 65%.   Baseline Modified end date to 9/28   Time 4   Period Weeks   Status Revised   Target Date 04/07/17     PT LONG TERM GOAL #3   Title The patient will tolerate right sidelying without  muscle spasms.     Baseline Continue to updated LTGs as patient can lay in this position, but is avoiding sleeping on the right side.   Time 4   Period Weeks   Status Revised   Target Date 04/07/17               Plan - 03/08/17 2053    Clinical Impression Statement Initial plan of care for PT set up for 6 visits, however the patient was found to have significant muscle tightness and trigger points over R IT band and vastus lateralis.  Dry needling in conjunction with soft tissue mobilization, stretching, and therex is allowing significant improvement.  PT to continue current long term goals x 4 more weeks (as needed) to continue to address deficits in order to optimize functional mobility and facilitate return to prior community wellness activities.    PT Frequency 2x / week   PT Duration 4 weeks   PT Treatment/Interventions ADLs/Self Care Home Management;Therapeutic exercise;Therapeutic activities;Manual techniques;Neuromuscular re-education;Patient/family education;Gait training;Functional mobility training;Cryotherapy;Moist Heat;Dry needling   PT Next Visit Plan Continue dry needling and soft tissue mobilization, progress exercise with emphasis on return to community activities  (did tai chi in past), and meditation practice   Consulted and Agree with Plan of Care Patient      Patient will benefit from skilled therapeutic intervention in order to improve the following deficits and impairments:  Abnormal gait, Increased muscle spasms, Increased fascial restricitons, Decreased mobility, Impaired flexibility  Visit Diagnosis: Pain in right leg  Muscle spasm of right lower extremity  Abnormal posture     Problem List Patient Active Problem List   Diagnosis Date Noted  .  Pancreatitis 12/24/2016  . Acute cholecystitis 12/24/2016  . DM (diabetes mellitus), type 2 ([place]) 12/24/2016  . Type 2 diabetes mellitus with hyperlipidemia ([place]) 12/24/2016  . Shortened PR interval 12/24/2016  . Dizziness 09/28/2016  . Former smoker 09/28/2016    [place], [place] 03/08/2017, 9:03 PM  [facility] [address] [place], [place], [address] Phone: [phone]   Fax:  [phone]  Name: Kimberly Brown MRN: [id number] Date of Birth: Oct 24, 1950

## 2017-03-16 ENCOUNTER — Encounter: Payer: Self-pay | Admitting: Physical Therapy

## 2017-03-16 ENCOUNTER — Ambulatory Visit: Payer: BLUE CROSS/BLUE SHIELD | Attending: Neurology | Admitting: Physical Therapy

## 2017-03-16 DIAGNOSIS — R293 Abnormal posture: Secondary | ICD-10-CM | POA: Insufficient documentation

## 2017-03-16 DIAGNOSIS — M79604 Pain in right leg: Secondary | ICD-10-CM

## 2017-03-16 DIAGNOSIS — M62838 Other muscle spasm: Secondary | ICD-10-CM | POA: Diagnosis present

## 2017-03-16 NOTE — Therapy (Signed)
[place], [place], [address] Phone: [phone]   Fax:  [phone]  Physical Therapy Treatment  Patient Details  Name: Kimberly Brown MRN: [id number] Date of Birth: [birth year] Referring Provider: Heide Spark, MD  Encounter Date: 03/16/2017      PT End of Session - 03/16/17 1800    Visit Number 7   Number of Visits 14   Date for PT Re-Evaluation 04/07/17   Authorization Type private insurance   PT Start Time 0845   PT Stop Time 0928   PT Time Calculation (min) 43 min   Activity Tolerance Patient tolerated treatment well   Behavior During Therapy [facility] for tasks assessed/performed      Past Medical History:  Diagnosis Date  . Diabetes mellitus without complication ([place])   . Hernia of abdominal cavity   . Pancreatitis     Past Surgical History:  Procedure Laterality Date  . ABDOMINAL HYSTERECTOMY    . BASAL CELL CARCINOMA EXCISION    . CHOLECYSTECTOMY N/A 12/25/2016   Procedure: LAPAROSCOPIC CHOLECYSTECTOMY WITH INTRAOPERATIVE CHOLANGIOGRAM;  Surgeon: Leighton Ruff, MD;  Location: [facility];  Service: General;  Laterality: N/A;    There were no vitals filed for this visit.      Subjective Assessment - 03/16/17 0852    Subjective Patient reports she is able to cross her legs now without pain. She continues to have some numbness in her knee but overall she is doing well. She has been doing her exercises and stretches at home without much difficulty.    Pertinent History Acute pancreatitis s/p surgery 6/17 (d/c home on 12/27/16).   34 lb weight loss in past 3-4 months.   Currently in Pain? No/denies                         [facility] Adult PT Treatment/Exercise - 03/16/17 0001      Knee/Hip Exercises: Supine   Quad Sets Limitations 2x10 into the ball    Other Supine Knee/Hip Exercises clam shells with light abdominal brace 2x10      Manual Therapy   Soft tissue mobilization  IASTYM to IT band and area neddled to reduce trigger points and psot needling soreness.           Trigger Point Dry Needling - 03/16/17 2041    Consent Given? Yes   Education Handout Provided Yes   Quadriceps Response Twitch response elicited              PT Education - 03/16/17 1800    Education provided Yes   Education Details reviewed TPDN risks and benefits    Person(s) Educated Patient   Methods Explanation   Comprehension Verbalized understanding;Returned demonstration          PT Short Term Goals - 03/16/17 2042      PT SHORT TERM GOAL #1   Title The patient will return demo HEP for LE stretching.   Time 4   Period Weeks   Status Achieved     PT SHORT TERM GOAL #2   Title The patient will be able to sit in a chair with knees flexed to 90 degrees without muscle spasms.   Baseline Avoids bending beyond 90 degrees.   Time 4   Period Weeks   Status Achieved           PT Long Term Goals - 03/08/17 1037      PT LONG TERM GOAL #  1   Title The patient will be indep with home program for post d/c stretching and mobility.   Baseline Patient has HEP, PT to continue to progress as indicated.   Time 4   Period Weeks   Status Revised   Target Date 04/07/17     PT LONG TERM GOAL #2   Title The patient will improve functional status score from 52% up to > or equal to 65%.   Baseline Modified end date to 9/28   Time 4   Period Weeks   Status Revised   Target Date 04/07/17     PT LONG TERM GOAL #3   Title The patient will tolerate right sidelying without  muscle spasms.     Baseline Continue to updated LTGs as patient can lay in this position, but is avoiding sleeping on the right side.   Time 4   Period Weeks   Status Revised   Target Date 04/07/17               Plan - 03/16/17 1802    Clinical Impression Statement Patient is making good progress. She continues to have numbness in her knee but it is no longer tingling. Giid twitch respose  elicited fom her vastus larteralis. She would benefit from continued dry needling.    Clinical Presentation Evolving   Clinical Decision Making Low   Rehab Potential Good   PT Frequency 2x / week   PT Duration 4 weeks   PT Treatment/Interventions ADLs/Self Care Home Management;Therapeutic exercise;Therapeutic activities;Manual techniques;Neuromuscular re-education;Patient/family education;Gait training;Functional mobility training;Cryotherapy;Moist Heat;Dry needling   PT Next Visit Plan Continue dry needling and soft tissue mobilization, progress exercise with emphasis on return to community activities (did tai chi in past), and meditation practice   Consulted and Agree with Plan of Care Patient      Patient will benefit from skilled therapeutic intervention in order to improve the following deficits and impairments:  Abnormal gait, Increased muscle spasms, Increased fascial restricitons, Decreased mobility, Impaired flexibility  Visit Diagnosis: Pain in right leg  Muscle spasm of right lower extremity  Abnormal posture     Problem List Patient Active Problem List   Diagnosis Date Noted  . Pancreatitis 12/24/2016  . Acute cholecystitis 12/24/2016  . DM (diabetes mellitus), type 2 ([place]) 12/24/2016  . Type 2 diabetes mellitus with hyperlipidemia ([place]) 12/24/2016  . Shortened PR interval 12/24/2016  . Dizziness 09/28/2016  . Former smoker 09/28/2016    Carney Living PT DPT  03/16/2017, 8:44 PM  [facility] [address] [place], [place], [address] Phone: [phone]   Fax:  [phone]  Name: Kimberly Brown MRN: [id number] Date of Birth: [birth year]

## 2017-03-22 ENCOUNTER — Ambulatory Visit: Payer: BLUE CROSS/BLUE SHIELD | Admitting: Physical Therapy

## 2017-03-22 DIAGNOSIS — M62838 Other muscle spasm: Secondary | ICD-10-CM

## 2017-03-22 DIAGNOSIS — M79604 Pain in right leg: Secondary | ICD-10-CM | POA: Diagnosis not present

## 2017-03-22 DIAGNOSIS — R293 Abnormal posture: Secondary | ICD-10-CM

## 2017-03-23 NOTE — Therapy (Signed)
[place], [place], [address] Phone: [phone]   Fax:  [phone]  Physical Therapy Treatment  Patient Details  Name: Kimberly Brown MRN: [id number] Date of Birth: 1951/07/02 Referring Provider: Heide Spark, MD  Encounter Date: 03/22/2017      PT End of Session - 03/23/17 1153    Visit Number 8   Number of Visits 14   Date for PT Re-Evaluation 04/07/17   Authorization Type private insurance   PT Start Time 0845   PT Stop Time 0927   PT Time Calculation (min) 42 min   Activity Tolerance Patient tolerated treatment well   Behavior During Therapy [facility] for tasks assessed/performed      Past Medical History:  Diagnosis Date  . Diabetes mellitus without complication ([place])   . Hernia of abdominal cavity   . Pancreatitis     Past Surgical History:  Procedure Laterality Date  . ABDOMINAL HYSTERECTOMY    . BASAL CELL CARCINOMA EXCISION    . CHOLECYSTECTOMY N/A 12/25/2016   Procedure: LAPAROSCOPIC CHOLECYSTECTOMY WITH INTRAOPERATIVE CHOLANGIOGRAM;  Surgeon: Leighton Ruff, MD;  Location: [facility];  Service: General;  Laterality: N/A;    There were no vitals filed for this visit.      Subjective Assessment - 03/23/17 1151    Subjective Patient reports that her pain has been much better. She is still having some numbness in her knee. She has been doing all of her exercises at home. She is having very little pain in her IT band.    Pertinent History Acute pancreatitis s/p surgery 6/17 (d/c home on 12/27/16).   34 lb weight loss in past 3-4 months.   Patient Stated Goals Reduce spasms.   Currently in Pain? No/denies                         [facility] Adult PT Treatment/Exercise - 03/23/17 0001      Knee/Hip Exercises: Supine   Quad Sets Limitations 2x10 with ball    Other Supine Knee/Hip Exercises clam shells with light abdominal brace 2x10      Manual Therapy   Manual Therapy Soft  tissue mobilization;Myofascial release   Manual therapy comments Positioned with R IT band in stretched position; LAD in supine 3x30 second holds; Improved nummbness,    Soft tissue mobilization IASTYM to IT band and area neddled to reduce trigger points and psot needling soreness. IT band roll ut;     Myofascial Release Along right IT band with sustained pressure.                PT Education - 03/23/17 1153    Education provided Yes   Education Details reviewed soft tissue mobilization    Person(s) Educated Patient   Methods Explanation   Comprehension Verbalized understanding;Returned demonstration          PT Short Term Goals - 03/16/17 2042      PT SHORT TERM GOAL #1   Title The patient will return demo HEP for LE stretching.   Time 4   Period Weeks   Status Achieved     PT SHORT TERM GOAL #2   Title The patient will be able to sit in a chair with knees flexed to 90 degrees without muscle spasms.   Baseline Avoids bending beyond 90 degrees.   Time 4   Period Weeks   Status Achieved  PT Long Term Goals - 03/08/17 1037      PT LONG TERM GOAL #1   Title The patient will be indep with home program for post d/c stretching and mobility.   Baseline Patient has HEP, PT to continue to progress as indicated.   Time 4   Period Weeks   Status Revised   Target Date 04/07/17     PT LONG TERM GOAL #2   Title The patient will improve functional status score from 52% up to > or equal to 65%.   Baseline Modified end date to 9/28   Time 4   Period Weeks   Status Revised   Target Date 04/07/17     PT LONG TERM GOAL #3   Title The patient will tolerate right sidelying without  muscle spasms.     Baseline Continue to updated LTGs as patient can lay in this position, but is avoiding sleeping on the right side.   Time 4   Period Weeks   Status Revised   Target Date 04/07/17               Plan - 03/23/17 1304    Clinical Impression Statement  Patient is making great progress. She is having no significant pain. She will be seen for 1 more visit then likley discharge to HEP. Patient only required 2 needles today in her vastus lateralis.    Clinical Presentation Evolving   Clinical Decision Making Low   Rehab Potential Good   PT Frequency 2x / week   PT Duration 4 weeks   PT Treatment/Interventions ADLs/Self Care Home Management;Therapeutic exercise;Therapeutic activities;Manual techniques;Neuromuscular re-education;Patient/family education;Gait training;Functional mobility training;Cryotherapy;Moist Heat;Dry needling   PT Next Visit Plan Continue dry needling and soft tissue mobilization, progress exercise with emphasis on return to community activities (did tai chi in past), and meditation practice   Consulted and Agree with Plan of Care Patient      Patient will benefit from skilled therapeutic intervention in order to improve the following deficits and impairments:  Abnormal gait, Increased muscle spasms, Increased fascial restricitons, Decreased mobility, Impaired flexibility  Visit Diagnosis: Pain in right leg  Muscle spasm of right lower extremity  Abnormal posture     Problem List Patient Active Problem List   Diagnosis Date Noted  . Pancreatitis 12/24/2016  . Acute cholecystitis 12/24/2016  . DM (diabetes mellitus), type 2 ([place]) 12/24/2016  . Type 2 diabetes mellitus with hyperlipidemia ([place]) 12/24/2016  . Shortened PR interval 12/24/2016  . Dizziness 09/28/2016  . Former smoker 09/28/2016    Carney Living PT DPT  03/23/2017, 1:07 PM  [facility] [address] [place], [place], [address] Phone: [phone]   Fax:  [phone]  Name: Kimberly Brown MRN: [id number] Date of Birth: [birth year]

## 2017-03-31 ENCOUNTER — Ambulatory Visit: Payer: BLUE CROSS/BLUE SHIELD | Admitting: Physical Therapy

## 2017-03-31 ENCOUNTER — Encounter: Payer: Self-pay | Admitting: Physical Therapy

## 2017-03-31 DIAGNOSIS — M62838 Other muscle spasm: Secondary | ICD-10-CM

## 2017-03-31 DIAGNOSIS — M79604 Pain in right leg: Secondary | ICD-10-CM | POA: Diagnosis not present

## 2017-03-31 DIAGNOSIS — R293 Abnormal posture: Secondary | ICD-10-CM

## 2017-03-31 NOTE — Therapy (Signed)
[place], [place], [address] Phone: [phone]   Fax:  [phone]  Physical Therapy Treatment  Patient Details  Name: Kimberly Brown MRN: [id number] Date of Birth: [birth year] Referring Provider: Heide Spark, MD  Encounter Date: 03/31/2017      PT End of Session - 03/31/17 1218    Visit Number 9   Number of Visits 14   Date for PT Re-Evaluation 04/07/17   Authorization Type private insurance   PT Start Time 0930   PT Stop Time 1012   PT Time Calculation (min) 42 min   Activity Tolerance Patient tolerated treatment well   Behavior During Therapy [facility] for tasks assessed/performed      Past Medical History:  Diagnosis Date  . Diabetes mellitus without complication ([place])   . Hernia of abdominal cavity   . Pancreatitis     Past Surgical History:  Procedure Laterality Date  . ABDOMINAL HYSTERECTOMY    . BASAL CELL CARCINOMA EXCISION    . CHOLECYSTECTOMY N/A 12/25/2016   Procedure: LAPAROSCOPIC CHOLECYSTECTOMY WITH INTRAOPERATIVE CHOLANGIOGRAM;  Surgeon: Leighton Ruff, MD;  Location: [facility];  Service: General;  Laterality: N/A;    There were no vitals filed for this visit.      Subjective Assessment - 03/31/17 1217    Subjective Patient reports everything is getting incremently better. She is walking further with less pain.    Pertinent History Acute pancreatitis s/p surgery 6/17 (d/c home on 12/27/16).   34 lb weight loss in past 3-4 months.   Patient Stated Goals Reduce spasms.   Currently in Pain? No/denies                                 PT Education - 03/31/17 1217    Education provided Yes   Education Details reviewed self soft tissue mobilization    Person(s) Educated Patient   Methods Explanation   Comprehension Verbalized understanding;Returned demonstration          PT Short Term Goals - 03/31/17 1220      PT SHORT TERM GOAL #1   Title The  patient will return demo HEP for LE stretching.   Time 4   Period Weeks   Status Achieved     PT SHORT TERM GOAL #2   Title The patient will be able to sit in a chair with knees flexed to 90 degrees without muscle spasms.   Baseline Avoids bending beyond 90 degrees.   Time 4   Period Weeks           PT Long Term Goals - 03/31/17 1220      PT LONG TERM GOAL #1   Title The patient will be indep with home program for post d/c stretching and mobility.   Baseline Patient has HEP, PT to continue to progress as indicated.   Time 4   Period Weeks   Status Achieved     PT LONG TERM GOAL #2   Title The patient will improve functional status score from 52% up to > or equal to 65%.   Baseline Modified end date to 9/28   Time 4   Period Weeks   Status Achieved     PT LONG TERM GOAL #3   Title The patient will tolerate right sidelying without  muscle spasms.     Baseline Continue to updated LTGs as patient can lay in this position,  but is avoiding sleeping on the right side.   Time 4   Period Weeks   Status Achieved               Plan - 03/31/17 1218    Clinical Impression Statement Patienthas made great progress. she had no pain with treatment. She feels comfortable with her exercises. D/C to HEP.    History and Personal Factors relevant to plan of care: featr avoidance;    Clinical Presentation Evolving   Rehab Potential Good   PT Frequency 2x / week   PT Duration 4 weeks   PT Next Visit Plan D/C    Consulted and Agree with Plan of Care Patient      Patient will benefit from skilled therapeutic intervention in order to improve the following deficits and impairments:  Abnormal gait, Increased muscle spasms, Increased fascial restricitons, Decreased mobility, Impaired flexibility  Visit Diagnosis: Pain in right leg  Muscle spasm of right lower extremity  Abnormal posture  PHYSICAL THERAPY DISCHARGE SUMMARY  Visits from Start of Care: 9  Current functional  level related to goals / functional outcomes: Walking and performing daily activity without much pain    Remaining deficits: Still having numbness; still not driving long distance    Education / Equipment: HEP  Plan: Patient agrees to discharge.  Patient goals were not met. Patient is being discharged due to meeting the stated rehab goals.  ?????       Problem List Patient Active Problem List   Diagnosis Date Noted  . Pancreatitis 12/24/2016  . Acute cholecystitis 12/24/2016  . DM (diabetes mellitus), type 2 ([place]) 12/24/2016  . Type 2 diabetes mellitus with hyperlipidemia ([place]) 12/24/2016  . Shortened PR interval 12/24/2016  . Dizziness 09/28/2016  . Former smoker 09/28/2016    Carney Living PT DPT  03/31/2017, 12:22 PM  [facility] [address] [place], [place], [address] Phone: [phone]   Fax:  [phone]  Name: TESSI EUSTACHE MRN: [id number] Date of Birth: 03/30/1951

## 2017-04-03 ENCOUNTER — Ambulatory Visit
Admission: RE | Admit: 2017-04-03 | Discharge: 2017-04-03 | Disposition: A | Discharge status: Home / Self Care | Payer: BLUE CROSS/BLUE SHIELD | Source: Ambulatory Visit | Attending: Physician Assistant | Admitting: Physician Assistant

## 2017-04-03 DIAGNOSIS — K769 Liver disease, unspecified: Secondary | ICD-10-CM

## 2017-04-03 MED ORDER — GADOBENATE DIMEGLUMINE 529 MG/ML IV SOLN
14.0000 mL | Freq: Once | INTRAVENOUS | Status: AC | PRN
  Administered 2017-04-03: 14 mL via INTRAVENOUS

## 2018-08-02 IMAGING — CT CT HEAD W/O CM
4 series · 16 of 47 positions shown, 18 images · non-contrast
Comparison: None.

CLINICAL DATA: Blurred vision, dizziness abdominal pressure in the
occiput foot over the last 5 days, history of migraine

EXAM:
CT HEAD WITHOUT CONTRAST
TECHNIQUE: Contiguous axial images were obtained from the base of the skull
through the vertex without intravenous contrast.

[Series 3: head without · axial · non-contrast · 0.41mm/px · z∈[-79,+36]mm · 7 of 31 slices shown, 9 images]
[im 4/31  brain]
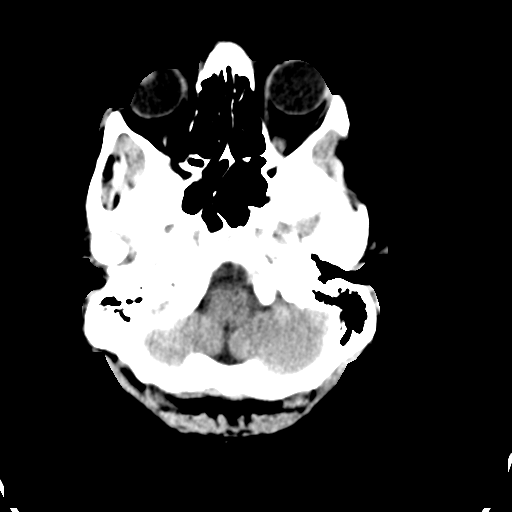
[im 4/31  bone]
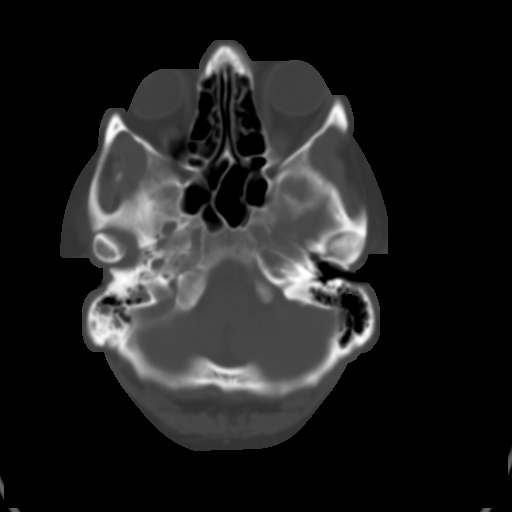
[im 8/31  brain]
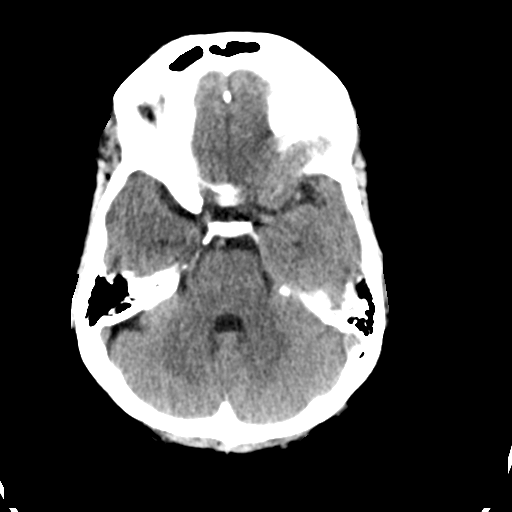
[im 12/31  brain]
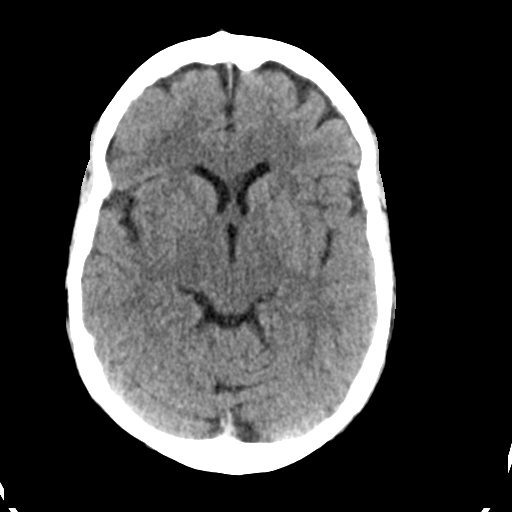
[im 16/31  brain]
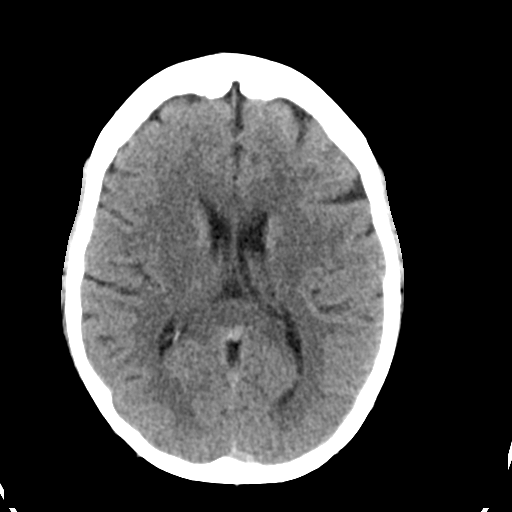
[im 19/31  brain]
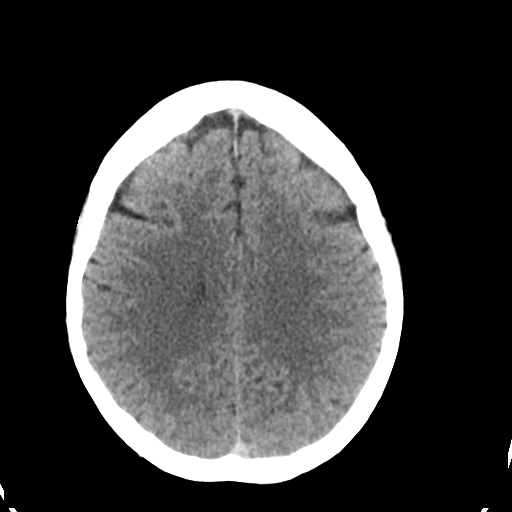
[im 19/31  bone]
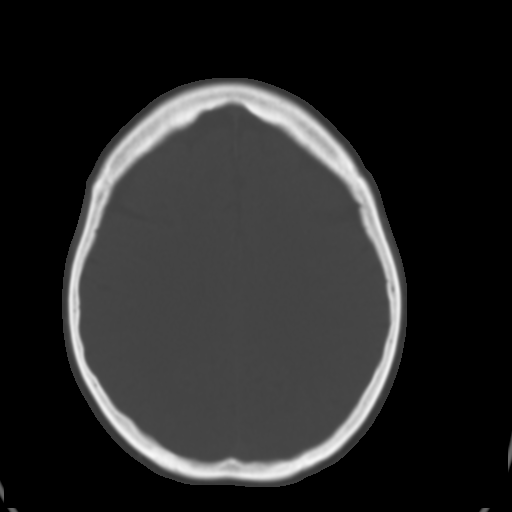
[im 23/31  brain]
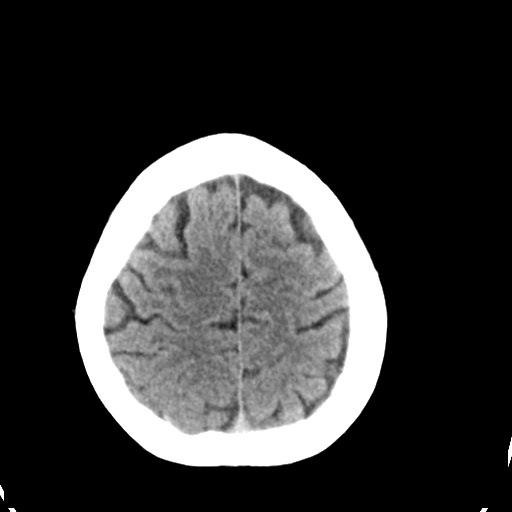
[im 27/31  brain]
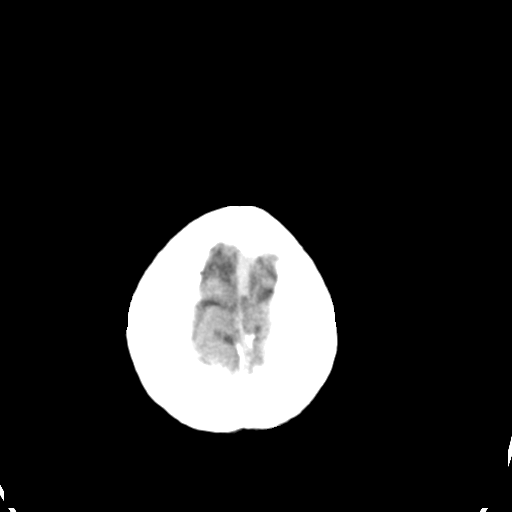

[Series 4: head bone · axial · 0.41mm/px · z∈[-80,-50]mm · 3 of 76 slices shown]
[im 8/76  bone]
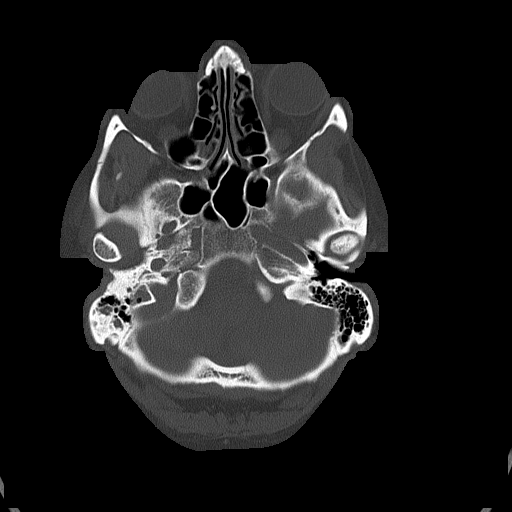
[im 16/76  bone]
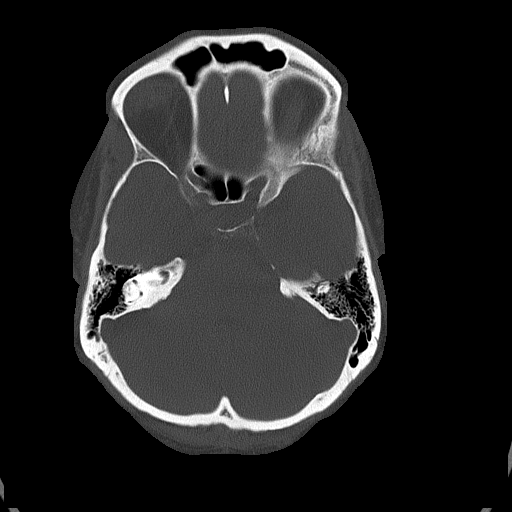
[im 23/76  bone]
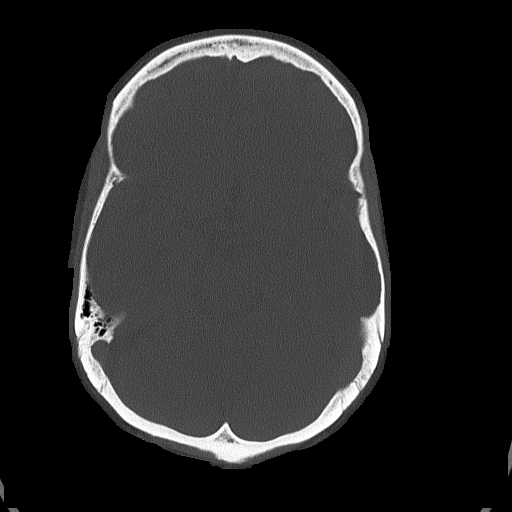

[Series 5: head without sag · sagittal · non-contrast · 0.30mm/px · 3 of 61 slices shown]
[im 21/61  brain]
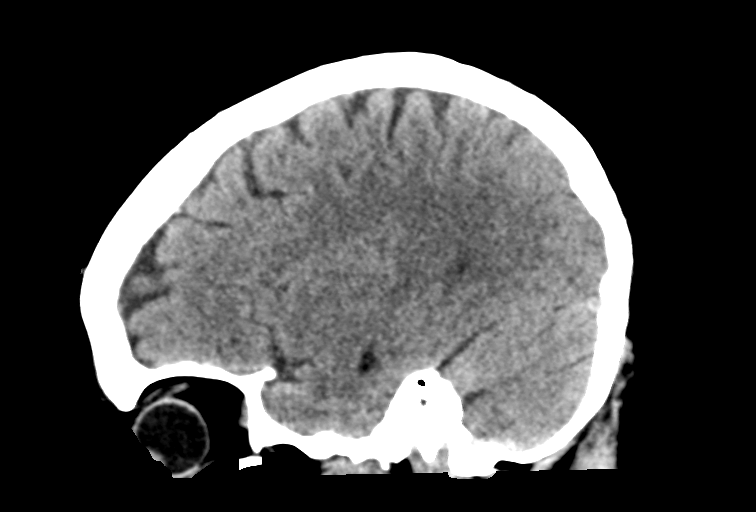
[im 31/61  brain]
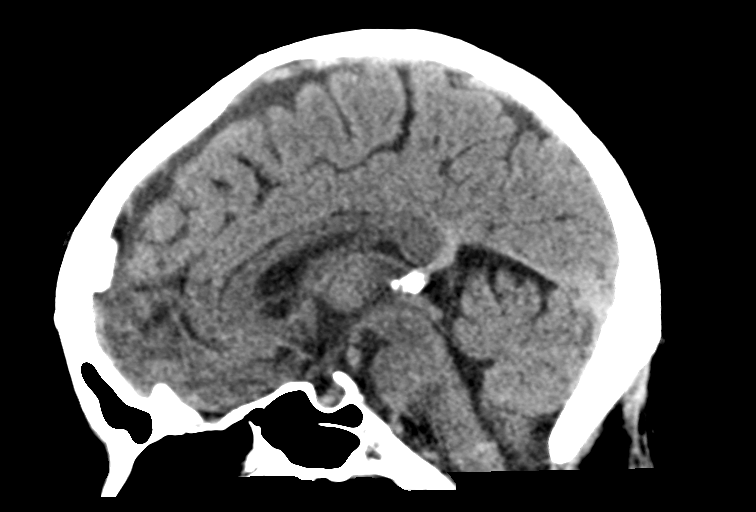
[im 41/61  brain]
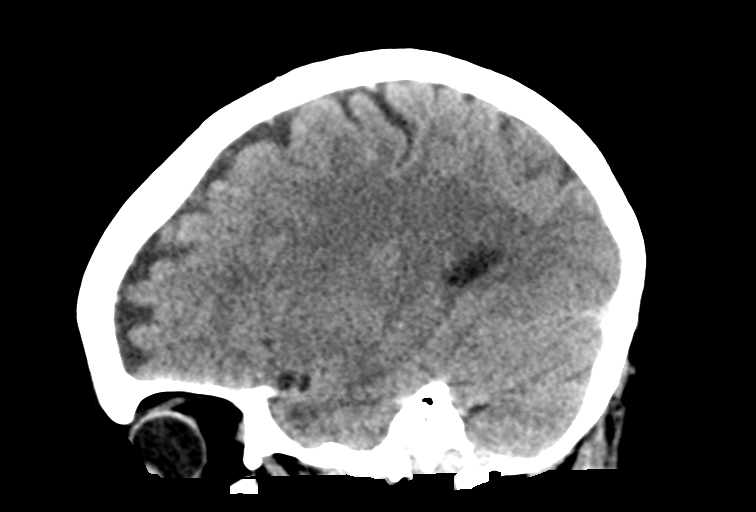

[Series 6: head without cor · coronal · non-contrast · 0.32mm/px · 3 of 67 slices shown]
[im 23/67  brain]
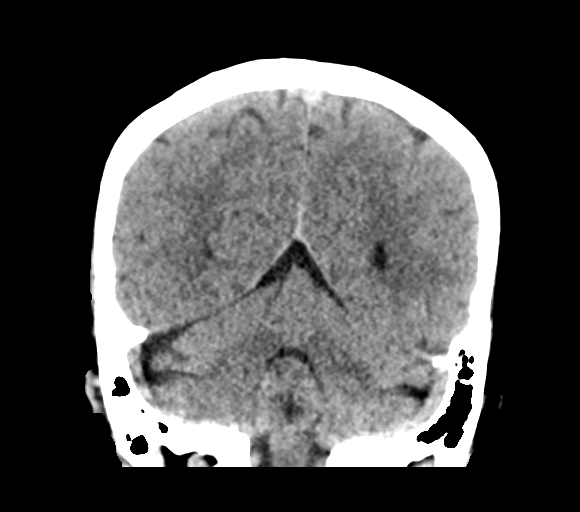
[im 30/67  brain]
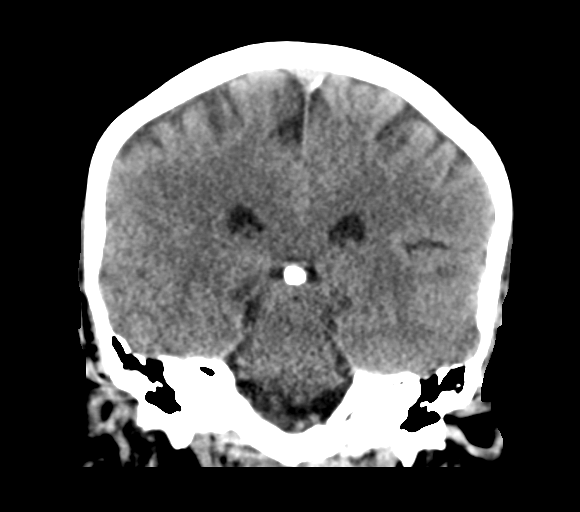
[im 37/67  brain]
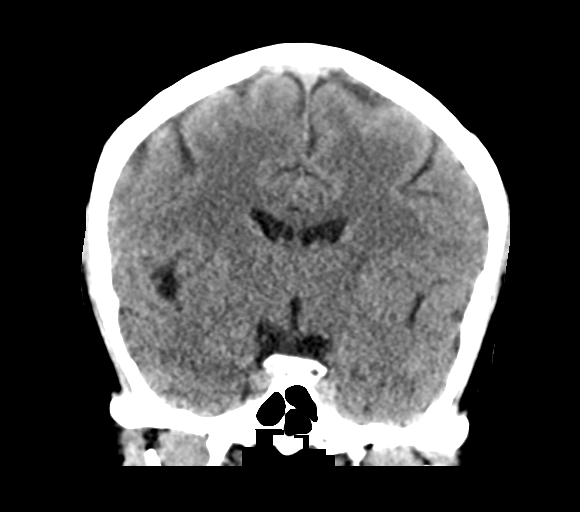

[16 of 47 positions shown; findings below may reference images not displayed]

FINDINGS: Brain: The ventricular system is within normal limits in size for
age and the septum is midline in position. The fourth ventricle and
basilar cisterns are unremarkable. No hemorrhage, mass lesion, or
acute infarction is seen.

Vascular: No vascular abnormality is seen on this unenhanced study.

Skull: On bone window images, no calvarial abnormality is seen.

Sinuses/Orbits: The paranasal sinuses are well pneumatized.

Other: None.
IMPRESSION: Negative unenhanced CT of the brain.

## 2018-11-10 IMAGING — DX DG ABDOMEN 2V
3 series · 3 of 3 positions shown · non-contrast
Comparison: CT Abdomen and Pelvis 06/21/2016 and earlier.

CLINICAL DATA: 65-year-old female with abdominal pain distention
nausea and vomiting for 1 day.

EXAM:
ABDOMEN - 2 VIEW

[abdomen erect]
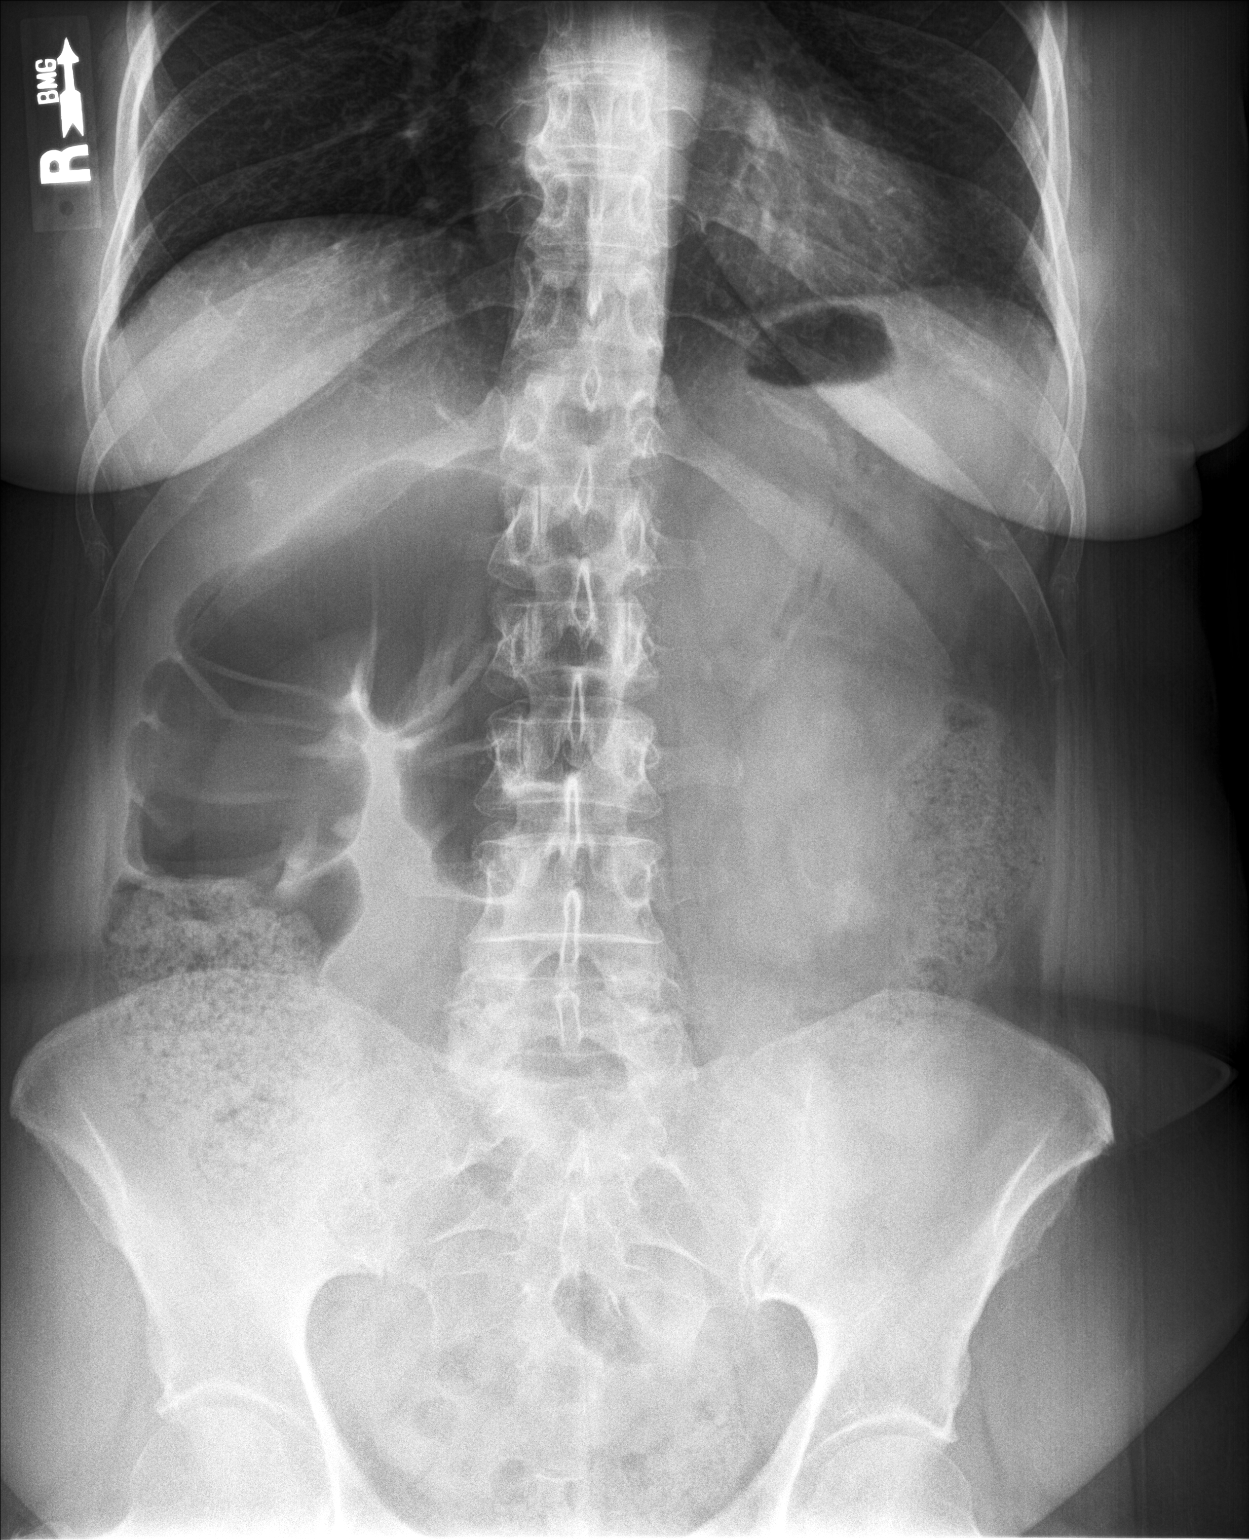

[abdomen supine (1 of 2)]
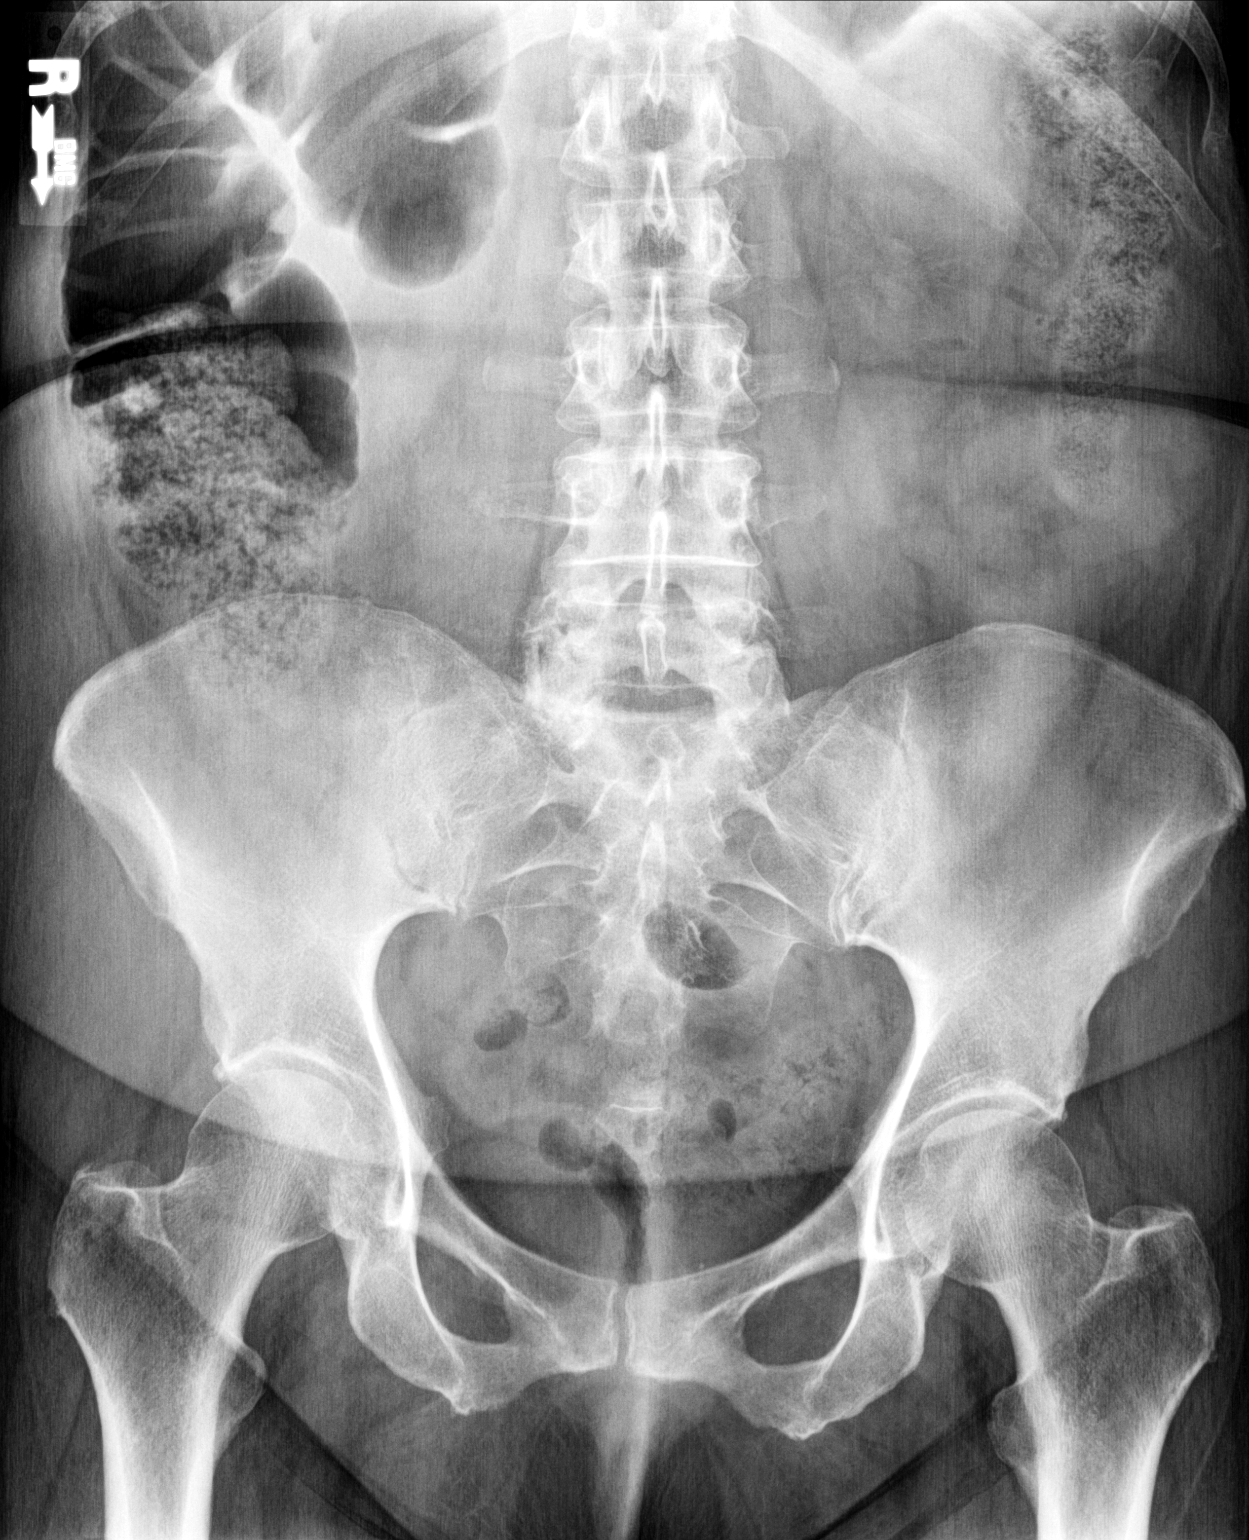

[abdomen supine (2 of 2)]
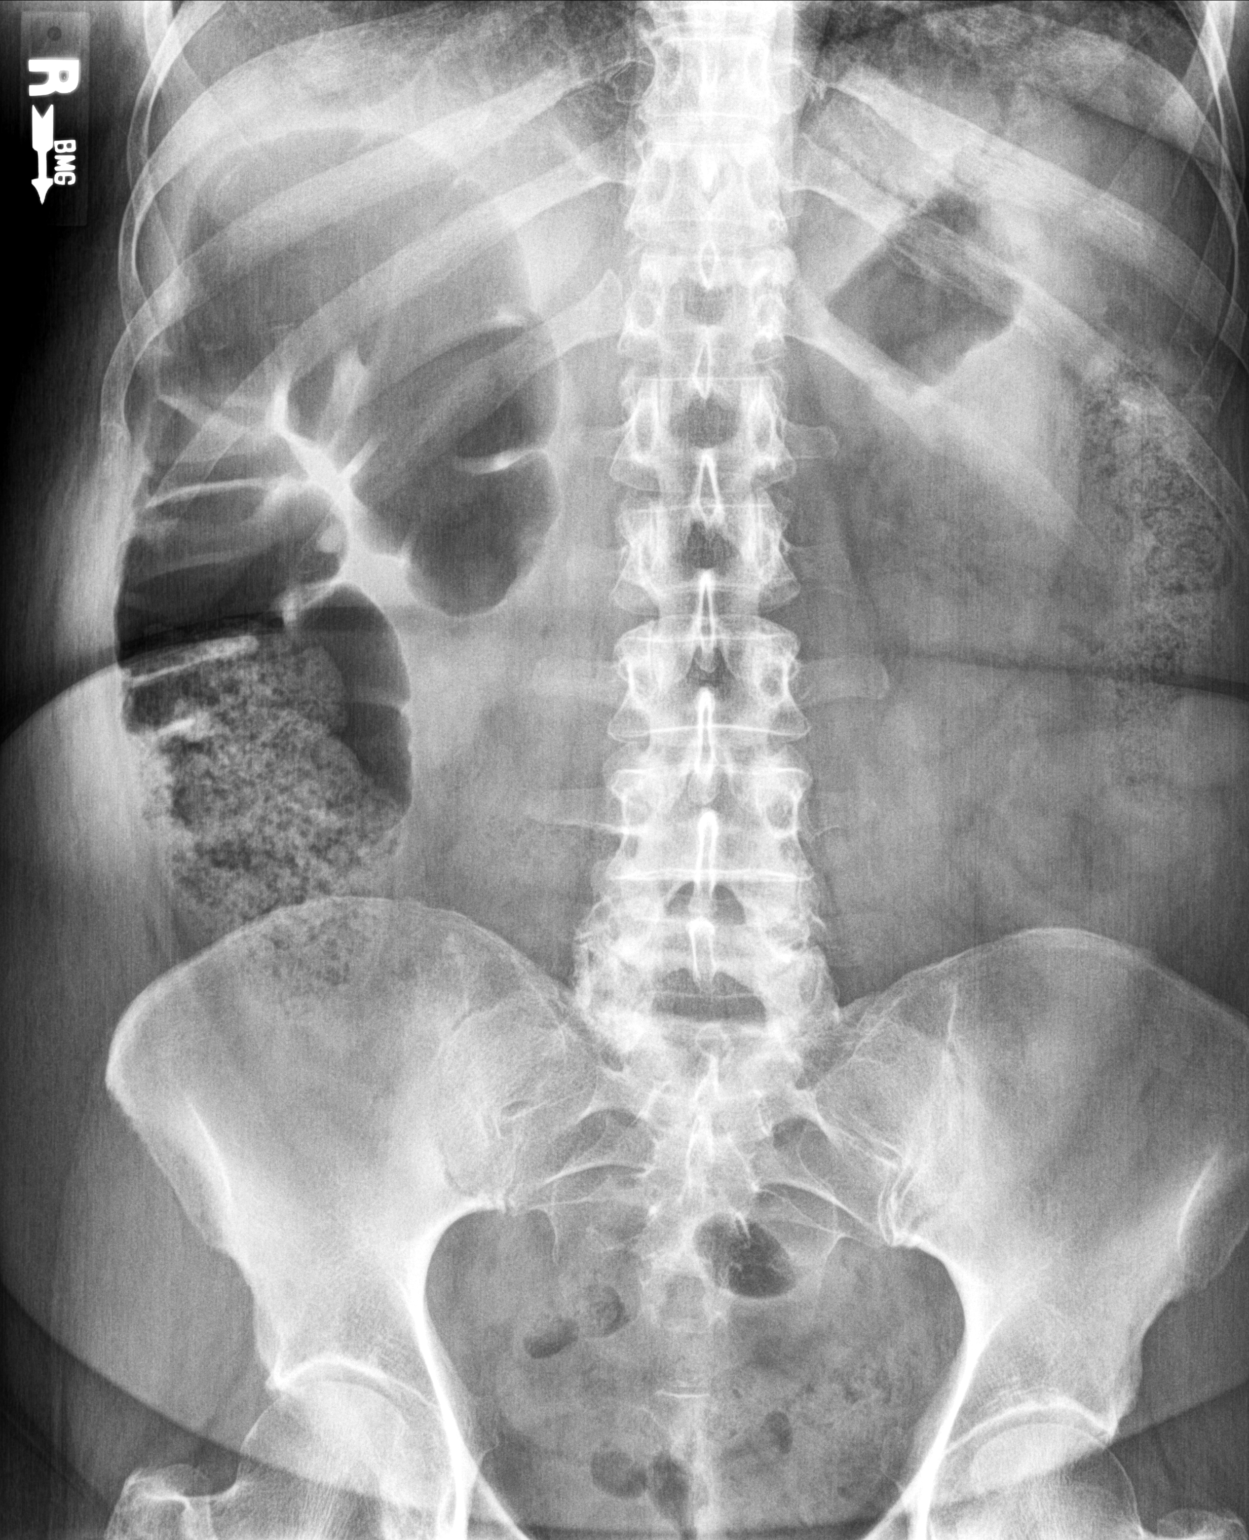

[3 of 3 positions shown; findings below may reference images not displayed]

FINDINGS: Upright and supine views of the abdomen and pelvis. Negative lung
bases.

No pneumoperitoneum. Retained stool in the colon. There is mild to
moderate gaseous distention of the ascending colon and hepatic
flexure, with a paucity of bowel gas elsewhere. Still, there is no
dilated small bowel. No right colon abnormality was identified at
the time of the CT in [REDACTED]. Evidence of decompressed small bowel
loops in the left and right lower abdomen. There is some gas in the
descending colon and sigmoid.

No acute osseous abnormality identified. Other abdominal and pelvic
visceral contours are within normal limits.
IMPRESSION: Conspicuous gaseous distension of the right colon, but no other
evidence of acute bowel obstruction and no free air. There is
retained stool in the more distal colon.

## 2021-12-21 ENCOUNTER — Ambulatory Visit: Payer: Medicare Other | Admitting: Podiatry

## 2021-12-28 ENCOUNTER — Ambulatory Visit: Payer: Medicare Other

## 2021-12-28 ENCOUNTER — Ambulatory Visit (INDEPENDENT_AMBULATORY_CARE_PROVIDER_SITE_OTHER): Payer: Medicare Other | Admitting: Podiatry

## 2021-12-28 ENCOUNTER — Encounter: Payer: Self-pay | Admitting: Podiatry

## 2021-12-28 DIAGNOSIS — D2371 Other benign neoplasm of skin of right lower limb, including hip: Secondary | ICD-10-CM

## 2021-12-28 DIAGNOSIS — E1142 Type 2 diabetes mellitus with diabetic polyneuropathy: Secondary | ICD-10-CM

## 2021-12-28 DIAGNOSIS — D2372 Other benign neoplasm of skin of left lower limb, including hip: Secondary | ICD-10-CM

## 2021-12-28 NOTE — Progress Notes (Signed)
  Subjective:  Patient ID: Kimberly Brown, female    DOB: Jan 07, 1951,  MRN: [id number] HPI Chief Complaint  Patient presents with   Foot Pain    Sub 1st MPJ bilateral - small, callused areas x 1 month, feels like sharp, sticking sensations, diabetic with neuropathy, last a1c was 5.9   New Patient (Initial Visit)    71 y.o. female presents with the above complaint.   ROS: Denies fever chills nausea vomiting muscle aches pains calf pain back pain chest pain shortness of breath.  Past Medical History:  Diagnosis Date   Diabetes mellitus without complication ([place])    Hernia of abdominal cavity    Pancreatitis    Past Surgical History:  Procedure Laterality Date   ABDOMINAL HYSTERECTOMY     BASAL CELL CARCINOMA EXCISION     CHOLECYSTECTOMY N/A 12/25/2016   Procedure: LAPAROSCOPIC CHOLECYSTECTOMY WITH INTRAOPERATIVE CHOLANGIOGRAM;  Surgeon: Leighton Ruff, MD;  Location: [facility];  Service: General;  Laterality: N/A;    Current Outpatient Medications:    Multiple Vitamin (MULTIVITAMIN) tablet, Take 1 tablet by mouth daily., Disp: , Rfl:    blood glucose meter kit and supplies KIT, Dispense based on patient and insurance preference. Use up to four times daily as directed. (FOR ICD-10 E11.9)., Disp: 1 each, Rfl: 0  No Known Allergies Review of Systems Objective:  There were no vitals filed for this visit.  General: Well developed, nourished, in no acute distress, alert and oriented x3   Dermatological: Skin is warm, dry and supple bilateral. Nails x 10 are well maintained; remaining integument appears unremarkable at this time. There are no open sores, no preulcerative lesions, no rash or signs of infection present.  She had a retention of a small piece of hair in the plantar aspect of the first metatarsophalangeal joint left foot.  She also demonstrates small cherry red petechial type lesions that are not raised but are mildly tender.  Vascular: Dorsalis Pedis artery and Posterior  Tibial artery pedal pulses are 2/4 bilateral with immedate capillary fill time. Pedal hair growth present. No varicosities and no lower extremity edema present bilateral.   Neruologic: Grossly intact via light touch bilateral. Vibratory intact via tuning fork bilateral. Protective threshold with Semmes Wienstein monofilament i slightly diminished to all pedal sites bilateral. Patellar and Achilles deep tendon reflexes 2+ bilateral. No Babinski or clonus noted bilateral.   Musculoskeletal: No gross boney pedal deformities bilateral. No pain, crepitus, or limitation noted with foot and ankle range of motion bilateral. Muscular strength 5/5 in all groups tested bilateral.  Gait: Unassisted, Nonantalgic.    Radiographs:  None taken  Assessment & Plan:   Assessment: Benign skin lesions with neuropathy.  Plan: Debrided multiple benign skin lesions plantar aspect of the bilateral foot follow-up with [place] on an as-needed basis     Max T. [place], [place]

## 2022-02-10 ENCOUNTER — Encounter: Payer: Self-pay | Admitting: Family Medicine

## 2022-02-10 ENCOUNTER — Ambulatory Visit (INDEPENDENT_AMBULATORY_CARE_PROVIDER_SITE_OTHER): Payer: Medicare Other | Admitting: Family Medicine

## 2022-02-10 VITALS — BP 130/80 | HR 67 | Temp 97.8°F | Ht 64.0 in | Wt 165.0 lb

## 2022-02-10 DIAGNOSIS — Z789 Other specified health status: Secondary | ICD-10-CM | POA: Diagnosis not present

## 2022-02-10 DIAGNOSIS — E1169 Type 2 diabetes mellitus with other specified complication: Secondary | ICD-10-CM | POA: Diagnosis not present

## 2022-02-10 DIAGNOSIS — Z5320 Procedure and treatment not carried out because of patient's decision for unspecified reasons: Secondary | ICD-10-CM

## 2022-02-10 DIAGNOSIS — Z7689 Persons encountering health services in other specified circumstances: Secondary | ICD-10-CM | POA: Diagnosis not present

## 2022-02-10 DIAGNOSIS — E785 Hyperlipidemia, unspecified: Secondary | ICD-10-CM | POA: Diagnosis not present

## 2022-02-10 DIAGNOSIS — M25562 Pain in left knee: Secondary | ICD-10-CM | POA: Diagnosis not present

## 2022-02-10 LAB — CBC WITH DIFFERENTIAL/PLATELET
Basophils Absolute: 0 10*3/uL (ref 0.0–0.1)
Basophils Relative: 0.7 % (ref 0.0–3.0)
Eosinophils Absolute: 0.1 10*3/uL (ref 0.0–0.7)
Eosinophils Relative: 1.2 % (ref 0.0–5.0)
HCT: 41.7 % (ref 36.0–46.0)
Hemoglobin: 14 g/dL (ref 12.0–15.0)
Lymphocytes Relative: 24.3 % (ref 12.0–46.0)
Lymphs Abs: 1 10*3/uL (ref 0.7–4.0)
MCHC: 33.6 g/dL (ref 30.0–36.0)
MCV: 91 fl (ref 78.0–100.0)
Monocytes Absolute: 0.4 10*3/uL (ref 0.1–1.0)
Monocytes Relative: 9.5 % (ref 3.0–12.0)
Neutro Abs: 2.6 10*3/uL (ref 1.4–7.7)
Neutrophils Relative %: 64.3 % (ref 43.0–77.0)
Platelets: 255 10*3/uL (ref 150.0–400.0)
RBC: 4.58 Mil/uL (ref 3.87–5.11)
RDW: 13.5 % (ref 11.5–15.5)
WBC: 4.1 10*3/uL (ref 4.0–10.5)

## 2022-02-10 LAB — COMPREHENSIVE METABOLIC PANEL
ALT: 14 U/L (ref 0–35)
AST: 17 U/L (ref 0–37)
Albumin: 4.5 g/dL (ref 3.5–5.2)
Alkaline Phosphatase: 48 U/L (ref 39–117)
BUN: 14 mg/dL (ref 6–23)
CO2: 25 mEq/L (ref 19–32)
Calcium: 9.5 mg/dL (ref 8.4–10.5)
Chloride: 105 mEq/L (ref 96–112)
Creatinine, Ser: 0.73 mg/dL (ref 0.40–1.20)
GFR: 84.19 mL/min (ref >60.00)
Glucose, Bld: 99 mg/dL (ref 70–99)
Potassium: 4.2 mEq/L (ref 3.5–5.1)
Sodium: 141 mEq/L (ref 135–145)
Total Bilirubin: 0.6 mg/dL (ref 0.2–1.2)
Total Protein: 7.5 g/dL (ref 6.0–8.3)

## 2022-02-10 LAB — MICROALBUMIN / CREATININE URINE RATIO
Creatinine,U: 82.2 mg/dL
Microalb Creat Ratio: 0.9 mg/g (ref 0.0–30.0)
Microalb, Ur: <0.7 mg/dL (ref 0.0–1.9)

## 2022-02-10 LAB — LIPID PANEL
Cholesterol: 197 mg/dL (ref 0–200)
HDL: 65.3 mg/dL (ref >39.00)
LDL Cholesterol: 118 mg/dL — ABNORMAL HIGH (ref 0–99)
NonHDL: 132.02
Total CHOL/HDL Ratio: 3
Triglycerides: 71 mg/dL (ref 0.0–149.0)
VLDL: 14.2 mg/dL (ref 0.0–40.0)

## 2022-02-10 LAB — HEMOGLOBIN A1C: Hgb A1c MFr Bld: 5.8 % (ref 4.6–6.5)

## 2022-02-10 LAB — VITAMIN B12: Vitamin B-12: 283 pg/mL (ref 211–911)

## 2022-02-10 LAB — TSH: TSH: 1.32 u[IU]/mL (ref 0.35–5.50)

## 2022-02-10 NOTE — Assessment & Plan Note (Signed)
Discussed getting adequate vitamins and minerals including calcium and iron in her diet in other ways. Check labs.

## 2022-02-10 NOTE — Assessment & Plan Note (Signed)
Question Baker's cyst.  She will call and schedule with her orthopedist.

## 2022-02-10 NOTE — Patient Instructions (Signed)
It was a pleasure meeting you today.  Thank you for trusting [place] with your health care.  Stop downstairs for labs before you leave today.  Call and schedule with your orthopedist as discussed.  We will be in touch with your lab results.  You may follow-up for preventative health care/Medicare wellness visit at your convenience.

## 2022-02-10 NOTE — Assessment & Plan Note (Signed)
She is overdue and preventive healthcare screening.  Declines mammogram and bone density today.  Denies ever having colon cancer screening.  She will schedule for a Medicare wellness/preventive health care visit with me in the next few weeks.

## 2022-02-10 NOTE — Assessment & Plan Note (Signed)
No regular medical care in approximately 4 to 5 years.  Diabetes has been diet controlled previously.  She is not on a statin.  Check labs including hemoglobin A1c and urine microalbumin creatinine ratio.  Encouraged yearly eye exams.  Follow-up pending results.  She will need caught up on vaccines if she is willing as well.

## 2022-02-10 NOTE — Progress Notes (Signed)
New Patient Office Visit  Subjective    Patient ID: Kimberly Brown, female    DOB: December 23, 1950  Age: 71 y.o. MRN: [id number]  CC:  Chief Complaint  Patient presents with   Establish Care    Would like A1C checked, also notes swelling behind left knee for a couple months now.     HPI Kimberly Brown presents to establish care  Previous medical care: [place]  Specialists:  Neurologist- Dr. Jaynee Eagles for peripheral neuropathy  [facility]  Diabetes- diagnosed in 2018.  States she has been diet controlled.  No hemoglobin A1c in several years.  States she gets annual eye exams.  Complains of peripheral neuropathy worse on the left.  Plans to do dry needling again soon.  She has done acupuncture in the past.  Eats a vegan diet mostly except for eggs.   Walks her dog 2 times per day.   History of hyperlipidemia and is not on a statin.  Complains of swelling and tenderness behind her left knee x2 months.  No difficulty ambulating or doing her activities.  No known injury.  States she has never had colon cancer screening.  She is overdue on mammogram and bone density.  She is overdue on vaccines as well.  She would like to hold off and discuss this at her next appointment.  Denies ever having a Medicare wellness visit.  Denies fever, chills, dizziness, chest pain, palpitations, shortness of breath, abdominal pain, N/V/D, LE edema.      Outpatient Encounter Medications as of 02/10/2022  Medication Sig   Multiple Vitamin (MULTIVITAMIN) tablet Take 1 tablet by mouth daily.   blood glucose meter kit and supplies KIT Dispense based on patient and insurance preference. Use up to four times daily as directed. (FOR ICD-10 E11.9). (Patient not taking: Reported on 02/10/2022)   No facility-administered encounter medications on file as of 02/10/2022.    Past Medical History:  Diagnosis Date   Diabetes mellitus without complication ([place])    Hernia of abdominal cavity     Pancreatitis     Past Surgical History:  Procedure Laterality Date   ABDOMINAL HYSTERECTOMY     BASAL CELL CARCINOMA EXCISION     CHOLECYSTECTOMY N/A 12/25/2016   Procedure: LAPAROSCOPIC CHOLECYSTECTOMY WITH INTRAOPERATIVE CHOLANGIOGRAM;  Surgeon: Leighton Ruff, MD;  Location: [facility];  Service: General;  Laterality: N/A;    Family History  Problem Relation Age of Onset   Kidney failure Sister    Cancer Mother 45       rectal   Stroke Neg Hx     Social History   Socioeconomic History   Marital status: Divorced    Spouse name: Not on file   Number of children: Not on file   Years of education: Not on file   Highest education level: Not on file  Occupational History   Not on file  Tobacco Use   Smoking status: Former   Smokeless tobacco: Never  Substance and Sexual Activity   Alcohol use: No   Drug use: No   Sexual activity: Never  Other Topics Concern   Not on file  Social History Narrative   Lives at home   Social Determinants of Health   Financial Resource Strain: Not on file  Food Insecurity: Not on file  Transportation Needs: Not on file  Physical Activity: Not on file  Stress: Not on file  Social Connections: Not on file  Intimate Partner Violence: Not on file    ROS  Pertinent positives and negatives in the history of present illness.      Objective    BP 130/80 (BP Location: Left Arm, Patient Position: Sitting, Cuff Size: Large)   Pulse 67   Temp 97.8 F (36.6 C) (Temporal)   Ht 5' 4"  (1.626 m)   Wt 165 lb (74.8 kg)   SpO2 96%   BMI 28.32 kg/m   Physical Exam Constitutional:      General: She is not in acute distress.    Appearance: She is not ill-appearing.  HENT:     Mouth/Throat:     Mouth: Mucous membranes are moist.  Eyes:     Conjunctiva/sclera: Conjunctivae normal.     Pupils: Pupils are equal, round, and reactive to light.  Cardiovascular:     Rate and Rhythm: Normal rate and regular rhythm.  Pulmonary:     Effort:  Pulmonary effort is normal.     Breath sounds: Normal breath sounds.  Musculoskeletal:     Left knee: No effusion. Normal range of motion. Tenderness present. Normal alignment.     Comments: TTP to posterior knee with palpable cyst. No laxity or crepitus. Normal ROM.   Skin:    General: Skin is warm and dry.  Neurological:     General: No focal deficit present.     Mental Status: She is alert and oriented to person, place, and time.  Psychiatric:        Mood and Affect: Mood normal.        Behavior: Behavior normal.         Assessment & Plan:   Problem List Items Addressed This Visit       Endocrine   Type 2 diabetes mellitus with hyperlipidemia ([place]) - Primary    No regular medical care in approximately 4 to 5 years.  Diabetes has been diet controlled previously.  She is not on a statin.  Check labs including hemoglobin A1c and urine microalbumin creatinine ratio.  Encouraged yearly eye exams.  Follow-up pending results.  She will need caught up on vaccines if she is willing as well.      Relevant Orders   CBC with Differential/Platelet   Comprehensive metabolic panel   TSH   Lipid panel   Hemoglobin A1c   Microalbumin / creatinine urine ratio     Other   Encounter to establish care   Mammogram declined    She is overdue and preventive healthcare screening.  Declines mammogram and bone density today.  Denies ever having colon cancer screening.  She will schedule for a Medicare wellness/preventive health care visit with me in the next few weeks.      Posterior knee pain, left    Question Baker's cyst.  She will call and schedule with her orthopedist.      Vegan diet    Discussed getting adequate vitamins and minerals including calcium and iron in her diet in other ways. Check labs.       Relevant Orders   CBC with Differential/Platelet   Vitamin B12   Iron, TIBC and Ferritin Panel    Return for Please schedule for Medicare wellness/preventive health care with  me.   Harland Dingwall, NP-C

## 2022-02-11 LAB — IRON,TIBC AND FERRITIN PANEL
%SAT: 29 % (calc) (ref 16–45)
Ferritin: 37 ng/mL (ref 16–288)
Iron: 104 ug/dL (ref 45–160)
TIBC: 362 mcg/dL (calc) (ref 250–450)

## 2022-02-17 ENCOUNTER — Ambulatory Visit (INDEPENDENT_AMBULATORY_CARE_PROVIDER_SITE_OTHER): Payer: Medicare Other | Admitting: Family Medicine

## 2022-02-17 ENCOUNTER — Encounter: Payer: Self-pay | Admitting: Family Medicine

## 2022-02-17 VITALS — BP 128/84 | HR 80 | Temp 97.6°F | Ht 64.0 in | Wt 163.0 lb

## 2022-02-17 DIAGNOSIS — Z0000 Encounter for general adult medical examination without abnormal findings: Secondary | ICD-10-CM

## 2022-02-17 DIAGNOSIS — Z1231 Encounter for screening mammogram for malignant neoplasm of breast: Secondary | ICD-10-CM

## 2022-02-17 DIAGNOSIS — E2839 Other primary ovarian failure: Secondary | ICD-10-CM | POA: Diagnosis not present

## 2022-02-17 DIAGNOSIS — Z1211 Encounter for screening for malignant neoplasm of colon: Secondary | ICD-10-CM

## 2022-02-17 DIAGNOSIS — Z23 Encounter for immunization: Secondary | ICD-10-CM | POA: Diagnosis not present

## 2022-02-17 DIAGNOSIS — Z90710 Acquired absence of both cervix and uterus: Secondary | ICD-10-CM

## 2022-02-17 NOTE — Progress Notes (Addendum)
No charge.  AWV done by PCP. This encounter was created in error - please disregard.

## 2022-02-17 NOTE — Progress Notes (Signed)
Subjective:    Patient ID: Kimberly Brown, female    DOB: 1951-02-25, 71 y.o.   MRN: [id number]  HPI Chief Complaint  Patient presents with   Annual Exam   She is fairly new to the practice and here for a complete physical exam, IPPE  She is not sexually active. Precancerous cells caused her to have a hysterectomy    Social history: Lives alone, works at home, self employeed   Diet: fairly healthy  Excerise: some days  Immunizations: pneumonia vaccine overdue.  Declines shingrix   Health maintenance:   Mammogram: ?2015 DEXA: never  Colonoscopy: never. Prefers to do Cologuard  Last Gynecological Exam: 2015  Last Dental Exam: Dr. Caryn Section, UTD Last Eye Exam: October 2022, yearly   No falls  Wears seatbelt always, smoke detectors in home and functioning, does not text while driving and feels safe in home environment.   Reviewed allergies, medications, past medical, surgical, family, and social history.    Review of Systems Review of Systems Constitutional: -fever, -chills, -sweats, -unexpected weight change,-fatigue ENT: -runny nose, -ear pain, -sore throat Cardiology:  -chest pain, -palpitations, -edema Respiratory: -cough, -shortness of breath, -wheezing Gastroenterology: -abdominal pain, -nausea, -vomiting, -diarrhea, -constipation  Hematology: -bleeding or bruising problems Musculoskeletal: -arthralgias, -myalgias, -joint swelling, -back pain Ophthalmology: -vision changes Urology: -dysuria, -difficulty urinating, -hematuria, -urinary frequency, -urgency Neurology: -headache, -weakness, -tingling, -numbness       Objective:   Physical Exam BP 128/84 (BP Location: Left Arm, Patient Position: Sitting, Cuff Size: Large)   Pulse 80   Temp 97.6 F (36.4 C) (Temporal)   Ht '5\' 4"'$  (1.626 m)   Wt 163 lb (73.9 kg)   SpO2 98%   BMI 27.98 kg/m   General Appearance:    Alert, cooperative, no distress, appears stated age  Head:    Normocephalic, without obvious  abnormality, atraumatic  Eyes:    PERRL, conjunctiva/corneas clear, EOM's intact  Ears:    Normal TM's and external ear canals  Nose:   Nares normal, mucosa normal, no drainage   Throat:   Lips, mucosa, and tongue normal; teeth and gums normal  Neck:   Supple, no lymphadenopathy;  thyroid:  no   enlargement/tenderness/nodules; no JVD  Back:    Spine nontender, no curvature, ROM normal, no CVA     tenderness  Lungs:     Clear to auscultation bilaterally without wheezes, rales or     ronchi; respirations unlabored  Chest Wall:    No tenderness or deformity   Heart:    Regular rate and rhythm  Breast Exam:    OB/GYN  Abdomen:     Soft, non-tender, nondistended, normoactive bowel sounds,    no masses, no hepatosplenomegaly  Genitalia:    OB/GYN     Extremities:   No clubbing, cyanosis or edema  Pulses:   2+ and symmetric all extremities  Skin:   Skin color, texture, turgor normal, no rashes or lesions  Lymph nodes:   Cervical, supraclavicular, and axillary nodes normal  Neurologic:   CNII-XII intact, normal strength, sensation and gait          Psych:   Normal mood, affect, hygiene and grooming.         Assessment & Plan:  Initial Medicare annual wellness visit -preventive healthcare reviewed and updated. She will call and schedule with gynecology.  Counseling on healthy lifestyle including diet and exercise.  Recommend regular dental and eye exams.  Immunization counseling done.  Discussed safety and health  promotion.  Estrogen deficiency - Plan: DG Bone Density -she will call to schedule   Need for vaccination against Streptococcus pneumoniae - Plan: Pneumococcal polysaccharide vaccine 23-valent greater than or equal to 2yo subcutaneous/IM  Encounter for screening mammogram for malignant neoplasm of breast - Plan: MM 3D SCREEN BREAST BILATERAL -she will call to schedule   Screen for colon cancer - Plan: Cologuard  Hx of hysterectomy

## 2022-02-17 NOTE — Patient Instructions (Signed)
I have ordered a Cologuard colon cancer screening test and that will be mailed to you.  Please call and schedule your mammogram and bone density test at the breast center on [facility] in [place].  You received your pneumonia vaccine (Pneumovax 23) today.  You will be due for your Prevnar 20 vaccine next year.  You may get your Tdap vaccine at your pharmacy as well as your shingles vaccine if you prefer.  Continue with getting adequate calcium in your diet (1200 mg) and taking a vitamin D supplement which may be included in your multivitamin.  We will follow-up with your results.  Obgyn Offices:   [facility] (Laurin Coder, NP) [address] [place],  [address] Phone: [phone]    Preventive Care 47 Years and Older, Female Preventive care refers to lifestyle choices and visits with your health care provider that can promote health and wellness. Preventive care visits are also called wellness exams. What can I expect for my preventive care visit? Counseling Your health care provider may ask you questions about your: Medical history, including: Past medical problems. Family medical history. Pregnancy and menstrual history. History of falls. Current health, including: Memory and ability to understand (cognition). Emotional well-being. Home life and relationship well-being. Sexual activity and sexual health. Lifestyle, including: Alcohol, nicotine or tobacco, and drug use. Access to firearms. Diet, exercise, and sleep habits. Work and work Statistician. Sunscreen use. Safety issues such as seatbelt and bike helmet use. Physical exam Your health care provider will check your: Height and weight. These may be used to calculate your BMI (body mass index). BMI is a measurement that tells if you are at a healthy weight. Waist circumference. This measures the distance around your waistline. This measurement also tells if you are at a healthy weight and may help predict  your risk of certain diseases, such as type 2 diabetes and high blood pressure. Heart rate and blood pressure. Body temperature. Skin for abnormal spots. What immunizations do I need?  Vaccines are usually given at various ages, according to a schedule. Your health care provider will recommend vaccines for you based on your age, medical history, and lifestyle or other factors, such as travel or where you work. What tests do I need? Screening Your health care provider may recommend screening tests for certain conditions. This may include: Lipid and cholesterol levels. Hepatitis C test. Hepatitis B test. HIV (human immunodeficiency virus) test. STI (sexually transmitted infection) testing, if you are at risk. Lung cancer screening. Colorectal cancer screening. Diabetes screening. This is done by checking your blood sugar (glucose) after you have not eaten for a while (fasting). Mammogram. Talk with your health care provider about how often you should have regular mammograms. BRCA-related cancer screening. This may be done if you have a family history of breast, ovarian, tubal, or peritoneal cancers. Bone density scan. This is done to screen for osteoporosis. Talk with your health care provider about your test results, treatment options, and if necessary, the need for more tests. Follow these instructions at home: Eating and drinking  Eat a diet that includes fresh fruits and vegetables, whole grains, lean protein, and low-fat dairy products. Limit your intake of foods with high amounts of sugar, saturated fats, and salt. Take vitamin and mineral supplements as recommended by your health care provider. Do not drink alcohol if your health care provider tells you not to drink. If you drink alcohol: Limit how much you have to 0-1 drink a day. Know how much  alcohol is in your drink. In the [place] one drink equals one 12 oz bottle of beer (355 mL), one 5 oz glass of wine (148 mL), or one 1 oz  glass of hard liquor (44 mL). Lifestyle Brush your teeth every morning and night with fluoride toothpaste. Floss one time each day. Exercise for at least 30 minutes 5 or more days each week. Do not use any products that contain nicotine or tobacco. These products include cigarettes, chewing tobacco, and vaping devices, such as e-cigarettes. If you need help quitting, ask your health care provider. Do not use drugs. If you are sexually active, practice safe sex. Use a condom or other form of protection in order to prevent STIs. Take aspirin only as told by your health care provider. Make sure that you understand how much to take and what form to take. Work with your health care provider to find out whether it is safe and beneficial for you to take aspirin daily. Ask your health care provider if you need to take a cholesterol-lowering medicine (statin). Find healthy ways to manage stress, such as: Meditation, yoga, or listening to music. Journaling. Talking to a trusted person. Spending time with friends and family. Minimize exposure to UV radiation to reduce your risk of skin cancer. Safety Always wear your seat belt while driving or riding in a vehicle. Do not drive: If you have been drinking alcohol. Do not ride with someone who has been drinking. When you are tired or distracted. While texting. If you have been using any mind-altering substances or drugs. Wear a helmet and other protective equipment during sports activities. If you have firearms in your house, make sure you follow all gun safety procedures. What's next? Visit your health care provider once a year for an annual wellness visit. Ask your health care provider how often you should have your eyes and teeth checked. Stay up to date on all vaccines. This information is not intended to replace advice given to you by your health care provider. Make sure you discuss any questions you have with your health care provider. Document  Revised: 12/23/2020 Document Reviewed: 12/23/2020 Elsevier Patient Education  [place].

## 2022-03-08 LAB — COLOGUARD: COLOGUARD: NEGATIVE

## 2022-03-10 ENCOUNTER — Telehealth: Payer: Self-pay | Admitting: Family Medicine

## 2022-03-10 DIAGNOSIS — G629 Polyneuropathy, unspecified: Secondary | ICD-10-CM

## 2022-03-10 NOTE — Telephone Encounter (Signed)
Pt called to request a referral for physical therapy. Pt [facility] to [facility] on [address] in [place] states she and Loletha Carrow have already discussed her going to physical therapy and having dry needling done. Pt already has an appointment for her PT  Phone: [phone]  Fax:[phone]  [place]: [phone]

## 2022-03-10 NOTE — Telephone Encounter (Signed)
Pt is requesting a referral for PT.   States you have already discussed this with her and already has an appt.

## 2022-03-11 NOTE — Telephone Encounter (Signed)
Referral placed.

## 2022-08-09 ENCOUNTER — Ambulatory Visit
Admission: RE | Admit: 2022-08-09 | Discharge: 2022-08-09 | Disposition: A | Discharge status: Home / Self Care | Payer: Medicare Other | Source: Ambulatory Visit | Attending: Family Medicine | Admitting: Family Medicine

## 2022-08-09 ENCOUNTER — Encounter: Payer: Self-pay | Admitting: Family Medicine

## 2022-08-09 DIAGNOSIS — M810 Age-related osteoporosis without current pathological fracture: Secondary | ICD-10-CM

## 2022-08-09 DIAGNOSIS — E2839 Other primary ovarian failure: Secondary | ICD-10-CM

## 2022-08-09 DIAGNOSIS — Z1231 Encounter for screening mammogram for malignant neoplasm of breast: Secondary | ICD-10-CM

## 2022-08-09 HISTORY — DX: Age-related osteoporosis without current pathological fracture: M81.0

## 2022-08-09 NOTE — Progress Notes (Signed)
Her bone density shows osteoporosis. We need to discuss treatment options. Please schedule her for a visit.

## 2022-08-11 ENCOUNTER — Other Ambulatory Visit: Payer: Self-pay | Admitting: Family Medicine

## 2022-08-11 DIAGNOSIS — R928 Other abnormal and inconclusive findings on diagnostic imaging of breast: Secondary | ICD-10-CM

## 2022-08-12 ENCOUNTER — Ambulatory Visit: Payer: Medicare Other | Admitting: Family Medicine

## 2022-08-18 ENCOUNTER — Encounter: Payer: Self-pay | Admitting: Family Medicine

## 2022-08-18 ENCOUNTER — Ambulatory Visit (INDEPENDENT_AMBULATORY_CARE_PROVIDER_SITE_OTHER): Payer: Medicare Other | Admitting: Family Medicine

## 2022-08-18 VITALS — BP 122/78 | HR 76 | Temp 97.8°F | Ht 64.0 in | Wt 170.0 lb

## 2022-08-18 DIAGNOSIS — M810 Age-related osteoporosis without current pathological fracture: Secondary | ICD-10-CM

## 2022-08-18 DIAGNOSIS — E559 Vitamin D deficiency, unspecified: Secondary | ICD-10-CM

## 2022-08-18 DIAGNOSIS — E2839 Other primary ovarian failure: Secondary | ICD-10-CM

## 2022-08-18 LAB — VITAMIN D 25 HYDROXY (VIT D DEFICIENCY, FRACTURES): VITD: 19.53 ng/mL — ABNORMAL LOW (ref 30.00–100.00)

## 2022-08-18 MED ORDER — IBANDRONATE SODIUM 150 MG PO TABS: 150.0000 mg | ORAL_TABLET | ORAL | 3 refills | Status: DC

## 2022-08-18 MED ORDER — VITAMIN D (ERGOCALCIFEROL) 1.25 MG (50000 UNIT) PO CAPS: 50000.0000 [IU] | ORAL_CAPSULE | ORAL | 0 refills | Status: AC

## 2022-08-18 NOTE — Progress Notes (Signed)
Subjective:     Patient ID: Kimberly Brown, female    DOB: 1951/04/25, 72 y.o.   MRN: [id number]  Chief Complaint  Patient presents with   Osteoporosis    HPI Patient is in today to discuss new diagnosis of osteoporosis.   She took HRT for a short time many years ago.   Taking calcium/Mg/D supplement.  Getting calcium in her diet.   Hx of vitamin D def.   She is getting plenty of weight bearing exercises.      Health Maintenance Due  Topic Date Due   OPHTHALMOLOGY EXAM  Never done   COLONOSCOPY (Pts 45-20yr Insurance coverage will need to be confirmed)  Never done   DTaP/Tdap/Td (3 - Td or Tdap) 09/06/2017   FOOT EXAM  12/08/2017   HEMOGLOBIN A1C  08/13/2022    Past Medical History:  Diagnosis Date   Diabetes [place] complication ([place]    Hernia of abdominal cavity    Osteoporosis 08/09/2022   Pancreatitis     Past Surgical History:  Procedure Laterality Date   ABDOMINAL HYSTERECTOMY     BASAL CELL CARCINOMA EXCISION     CHOLECYSTECTOMY N/A 12/25/2016   Procedure: LAPAROSCOPIC CHOLECYSTECTOMY WITH INTRAOPERATIVE CHOLANGIOGRAM;  Surgeon: TLeighton Ruff MD;  Location: [facility];  Service: General;  Laterality: N/A;    Family History  Problem Relation Age of Onset   Cancer Mother 858      rectal   Arthritis Mother    Miscarriages / SKoreaMother    Cancer Father    Kidney failure Sister    Stroke Neg Hx     Social History   Socioeconomic History   Marital status: Single    Spouse name: Not on file   Number of children: Not on file   Years of education: Not on file   Highest education level: Not on file  Occupational History   Not on file  Tobacco Use   Smoking status: Former   Smokeless tobacco: Never  Substance and Sexual Activity   Alcohol use: No   Drug use: No   Sexual activity: Never  Other Topics Concern   Not on file  Social History Narrative   Lives at home   Social Determinants of Health   Financial Resource  Strain: Not on file  Food Insecurity: Not on file  Transportation Needs: Not on file  Physical Activity: Not on file  Stress: Not on file  Social Connections: Not on file  Intimate Partner Violence: Not on file    Outpatient Medications Prior to Visit  Medication Sig Dispense Refill   blood glucose meter kit and supplies KIT Dispense based on patient and insurance preference. Use up to four times daily as directed. (FOR ICD-10 E11.9). 1 each 0   CALCIUM-MAGNESIUM-VITAMIN D PO Take by mouth. 1000 mg of calcium  500 mg of magnesium 400 IU of Vit D     Multiple Vitamin (MULTIVITAMIN) tablet Take 1 tablet by mouth daily. Vit D 400 IU Calcium 200 mg     No facility-administered medications prior to visit.    No Known Allergies  ROS     Objective:    Physical Exam Constitutional:      General: She is not in acute distress.    Appearance: She is not ill-appearing.  Cardiovascular:     Rate and Rhythm: Normal rate.  Pulmonary:     Effort: Pulmonary effort is normal.  Neurological:     General: No focal  deficit present.     Mental Status: She is alert and oriented to person, place, and time.  Psychiatric:        Mood and Affect: Mood normal.        Behavior: Behavior normal.     BP 122/78 (BP Location: Left Arm, Patient Position: Sitting, Cuff Size: Large)   Pulse 76   Temp 97.8 F (36.6 C) (Temporal)   Ht '5\' 4"'$  (1.626 m)   Wt 170 lb (77.1 kg)   SpO2 96%   BMI 29.18 kg/m  Wt Readings from Last 3 Encounters:  08/18/22 170 lb (77.1 kg)  02/17/22 163 lb (73.9 kg)  02/17/22 163 lb (73.9 kg)       Assessment & Plan:   Problem List Items Addressed This Visit       Musculoskeletal and Integument   Osteoporosis - Primary   Relevant Medications   CALCIUM-MAGNESIUM-VITAMIN D PO   ibandronate (BONIVA) 150 MG tablet   Vitamin D, Ergocalciferol, (DRISDOL) 1.25 MG (50000 UNIT) CAPS capsule   Other Visit Diagnoses     Vitamin D deficiency       Relevant Medications    Vitamin D, Ergocalciferol, (DRISDOL) 1.25 MG (50000 UNIT) CAPS capsule   Other Relevant Orders   VITAMIN D 25 Hydroxy (Vit-D Deficiency, Fractures) (Completed)   Estrogen deficiency          Reviewed recent DEXA showing osteoporosis.  Counseling on getting adequate vitamin D, calcium and weight bearing exercises.  Check vitamin D level and replace as appropriate.  Start Boniva. No obvious contraindication. Counseled on how to take the medications.  Follow up in 3 months.   I am having Kimberly Laster. Brown start on ibandronate and Vitamin D (Ergocalciferol). I am also having her maintain her blood glucose meter kit and supplies, multivitamin, and CALCIUM-MAGNESIUM-VITAMIN D PO.  Meds ordered this encounter  Medications   ibandronate (BONIVA) 150 MG tablet    Sig: Take 1 tablet (150 mg total) by mouth every 30 (thirty) days. Take in the morning with a full glass of water, on an empty stomach, and do not take anything else by mouth or lie down for the next 30 min.    Dispense:  3 tablet    Refill:  3    Order Specific Question:   Supervising Provider    Answer:   Pricilla Holm A [[id number]]   Vitamin D, Ergocalciferol, (DRISDOL) 1.25 MG (50000 UNIT) CAPS capsule    Sig: Take 1 capsule (50,000 Units total) by mouth every 7 (seven) days.    Dispense:  12 capsule    Refill:  0    Order Specific Question:   Supervising Provider    Answer:   Pricilla Holm A [[id number]]

## 2022-08-18 NOTE — Patient Instructions (Signed)
Please go downstairs to the lab before you leave.   Do not take more than 500 mg of calcium (supplemental) at one time.   Start Boniva and take it once monthly.    Osteoporosis  Osteoporosis happens when the bones become thin and less dense than normal. Osteoporosis makes bones more brittle and fragile and more likely to break (fracture). Over time, osteoporosis can cause your bones to become so weak that they fracture after a minor fall. Bones in the hip, wrist, and spine are most likely to fracture due to osteoporosis. What are the causes? The exact cause of this condition is not known. What increases the risk? You are more likely to develop this condition if you: Have family members with this condition. Have poor nutrition. Use the following: Steroid medicines, such as prednisone. Anti-seizure medicines. Nicotine or tobacco, such as cigarettes, e-cigarettes, and chewing tobacco. Are female. Are age 18 or older. Are not physically active (are sedentary). Are of European or Asian descent. Have a small body frame. What are the signs or symptoms? A fracture might be the first sign of osteoporosis, especially if the fracture results from a fall or injury that usually would not cause a bone to break. Other signs and symptoms include: Pain in the neck or low back. Stooped posture. Loss of height. How is this diagnosed? This condition may be diagnosed based on: Your medical history. A physical exam. A bone mineral density test, also called a DXA or DEXA test (dual-energy X-ray absorptiometry test). This test uses X-rays to measure the amount of minerals in your bones. How is this treated? This condition may be treated by: Making lifestyle changes, such as: Including foods with more calcium and vitamin D in your diet. Doing weight-bearing and muscle-strengthening exercises. Stopping tobacco use. Limiting alcohol intake. Taking medicine to slow the process of bone loss or to  increase bone density. Taking daily supplements of calcium and vitamin D. Taking hormone replacement medicines, such as estrogen for women and testosterone for men. Monitoring your levels of calcium and vitamin D. The goal of treatment is to strengthen your bones and lower your risk for a fracture. Follow these instructions at home: Eating and drinking Include calcium and vitamin D in your diet. Calcium is important for bone health, and vitamin D helps your body absorb calcium. Good sources of calcium and vitamin D include: Certain fatty fish, such as salmon and tuna. Products that have calcium and vitamin D added to them (are fortified), such as fortified cereals. Egg yolks. Cheese. Liver.  Activity Do exercises as told by your health care provider. Ask your health care provider what exercises and activities are safe for you. You should do: Exercises that make you work against gravity (weight-bearing exercises), such as tai chi, yoga, or walking. Exercises to strengthen muscles, such as lifting weights. Lifestyle Do not drink alcohol if: Your health care provider tells you not to drink. You are pregnant, may be pregnant, or are planning to become pregnant. If you drink alcohol: Limit how much you use to: 0-1 drink a day for women. 0-2 drinks a day for men. Know how much alcohol is in your drink. In the [place] one drink equals one 12 oz bottle of beer (355 mL), one 5 oz glass of wine (148 mL), or one 1 oz glass of hard liquor (44 mL). Do not use any products that contain nicotine or tobacco, such as cigarettes, e-cigarettes, and chewing tobacco. If you need help quitting, ask your  health care provider. Preventing falls Use devices to help you move around (mobility aids) as needed, such as canes, walkers, scooters, or crutches. Keep rooms well-lit and clutter-free. Remove tripping hazards from walkways, including cords and throw rugs. Install grab bars in bathrooms and safety rails on  stairs. Use rubber mats in the bathroom and other areas that are often wet or slippery. Wear closed-toe shoes that fit well and support your feet. Wear shoes that have rubber soles or low heels. Review your medicines with your health care provider. Some medicines can cause dizziness or changes in blood pressure, which can increase your risk of falling. General instructions Take over-the-counter and prescription medicines only as told by your health care provider. Keep all follow-up visits. This is important. Contact a health care provider if: You have never been screened for osteoporosis and you are: A woman who is age 91 or older. A man who is age 80 or older. Get help right away if: You fall or injure yourself. Summary Osteoporosis is thinning and loss of density in your bones. This makes bones more brittle and fragile and more likely to break (fracture),even with minor falls. The goal of treatment is to strengthen your bones and lower your risk for a fracture. Include calcium and vitamin D in your diet. Calcium is important for bone health, and vitamin D helps your body absorb calcium. Talk with your health care provider about screening for osteoporosis if you are a woman who is age 52 or older, or a man who is age 64 or older. This information is not intended to replace advice given to you by your health care provider. Make sure you discuss any questions you have with your health care provider. Document Revised: 12/12/2019 Document Reviewed: 12/12/2019 Elsevier Patient Education  [place].

## 2022-08-19 ENCOUNTER — Other Ambulatory Visit: Payer: Self-pay | Admitting: Family Medicine

## 2022-08-19 ENCOUNTER — Ambulatory Visit
Admission: RE | Admit: 2022-08-19 | Discharge: 2022-08-19 | Disposition: A | Discharge status: Home / Self Care | Payer: Medicare Other | Source: Ambulatory Visit | Attending: Family Medicine | Admitting: Family Medicine

## 2022-08-19 DIAGNOSIS — R928 Other abnormal and inconclusive findings on diagnostic imaging of breast: Secondary | ICD-10-CM

## 2023-07-03 ENCOUNTER — Encounter: Payer: Self-pay | Admitting: Family Medicine

## 2023-07-03 LAB — HM DIABETES EYE EXAM

## 2023-07-03 NOTE — Telephone Encounter (Signed)
 Care team updated and letter sent for eye exam notes.

## 2023-07-28 ENCOUNTER — Other Ambulatory Visit: Payer: Self-pay | Admitting: Family Medicine

## 2023-07-28 DIAGNOSIS — M810 Age-related osteoporosis without current pathological fracture: Secondary | ICD-10-CM

## 2024-01-23 ENCOUNTER — Encounter: Payer: Self-pay | Admitting: Family Medicine

## 2024-01-23 ENCOUNTER — Ambulatory Visit: Payer: Self-pay | Admitting: Family Medicine

## 2024-01-23 ENCOUNTER — Ambulatory Visit (INDEPENDENT_AMBULATORY_CARE_PROVIDER_SITE_OTHER): Admitting: Family Medicine

## 2024-01-23 VITALS — BP 128/82 | HR 70 | Temp 97.9°F | Ht 64.0 in | Wt 175.0 lb

## 2024-01-23 DIAGNOSIS — E1140 Type 2 diabetes mellitus with diabetic neuropathy, unspecified: Secondary | ICD-10-CM | POA: Diagnosis not present

## 2024-01-23 DIAGNOSIS — G629 Polyneuropathy, unspecified: Secondary | ICD-10-CM | POA: Diagnosis not present

## 2024-01-23 DIAGNOSIS — Z789 Other specified health status: Secondary | ICD-10-CM

## 2024-01-23 DIAGNOSIS — E7800 Pure hypercholesterolemia, unspecified: Secondary | ICD-10-CM | POA: Diagnosis not present

## 2024-01-23 DIAGNOSIS — E559 Vitamin D deficiency, unspecified: Secondary | ICD-10-CM | POA: Diagnosis not present

## 2024-01-23 DIAGNOSIS — R350 Frequency of micturition: Secondary | ICD-10-CM

## 2024-01-23 DIAGNOSIS — E119 Type 2 diabetes mellitus without complications: Secondary | ICD-10-CM | POA: Diagnosis not present

## 2024-01-23 DIAGNOSIS — M810 Age-related osteoporosis without current pathological fracture: Secondary | ICD-10-CM | POA: Diagnosis not present

## 2024-01-23 DIAGNOSIS — Z01419 Encounter for gynecological examination (general) (routine) without abnormal findings: Secondary | ICD-10-CM

## 2024-01-23 LAB — URINALYSIS, ROUTINE W REFLEX MICROSCOPIC
Bilirubin Urine: NEGATIVE
Hgb urine dipstick: NEGATIVE
Ketones, ur: NEGATIVE
Leukocytes,Ua: NEGATIVE
Nitrite: NEGATIVE
Specific Gravity, Urine: 1.015 (ref 1.000–1.030)
Total Protein, Urine: NEGATIVE
Urine Glucose: NEGATIVE
Urobilinogen, UA: 0.2 (ref 0.0–1.0)
pH: 7 (ref 5.0–8.0)

## 2024-01-23 LAB — COMPREHENSIVE METABOLIC PANEL WITH GFR
ALT: 15 U/L (ref 0–35)
AST: 17 U/L (ref 0–37)
Albumin: 4.5 g/dL (ref 3.5–5.2)
Alkaline Phosphatase: 42 U/L (ref 39–117)
BUN: 17 mg/dL (ref 6–23)
CO2: 27 meq/L (ref 19–32)
Calcium: 9.5 mg/dL (ref 8.4–10.5)
Chloride: 104 meq/L (ref 96–112)
Creatinine, Ser: 0.66 mg/dL (ref 0.40–1.20)
GFR: 88.58 mL/min (ref >60.00)
Glucose, Bld: 98 mg/dL (ref 70–99)
Potassium: 4.1 meq/L (ref 3.5–5.1)
Sodium: 138 meq/L (ref 135–145)
Total Bilirubin: 0.5 mg/dL (ref 0.2–1.2)
Total Protein: 7.1 g/dL (ref 6.0–8.3)

## 2024-01-23 LAB — MAGNESIUM: Magnesium: 2.2 mg/dL (ref 1.5–2.5)

## 2024-01-23 LAB — LIPID PANEL
Cholesterol: 199 mg/dL (ref 0–200)
HDL: 63.6 mg/dL (ref >39.00)
LDL Cholesterol: 108 mg/dL — ABNORMAL HIGH (ref 0–99)
NonHDL: 135.01
Total CHOL/HDL Ratio: 3
Triglycerides: 134 mg/dL (ref 0.0–149.0)
VLDL: 26.8 mg/dL (ref 0.0–40.0)

## 2024-01-23 LAB — MICROALBUMIN / CREATININE URINE RATIO
Creatinine,U: 63.8 mg/dL
Microalb Creat Ratio: 16.1 mg/g (ref 0.0–30.0)
Microalb, Ur: 1 mg/dL (ref 0.0–1.9)

## 2024-01-23 LAB — T4, FREE: Free T4: 0.9 ng/dL (ref 0.60–1.60)

## 2024-01-23 LAB — CBC WITH DIFFERENTIAL/PLATELET
Basophils Absolute: 0 K/uL (ref 0.0–0.1)
Basophils Relative: 0.4 % (ref 0.0–3.0)
Eosinophils Absolute: 0 K/uL (ref 0.0–0.7)
Eosinophils Relative: 0.5 % (ref 0.0–5.0)
HCT: 41.8 % (ref 36.0–46.0)
Hemoglobin: 14.1 g/dL (ref 12.0–15.0)
Lymphocytes Relative: 24.7 % (ref 12.0–46.0)
Lymphs Abs: 1.2 K/uL (ref 0.7–4.0)
MCHC: 33.6 g/dL (ref 30.0–36.0)
MCV: 90.8 fl (ref 78.0–100.0)
Monocytes Absolute: 0.5 K/uL (ref 0.1–1.0)
Monocytes Relative: 9.3 % (ref 3.0–12.0)
Neutro Abs: 3.3 K/uL (ref 1.4–7.7)
Neutrophils Relative %: 65.1 % (ref 43.0–77.0)
Platelets: 267 K/uL (ref 150.0–400.0)
RBC: 4.61 Mil/uL (ref 3.87–5.11)
RDW: 13.5 % (ref 11.5–15.5)
WBC: 5.1 K/uL (ref 4.0–10.5)

## 2024-01-23 LAB — TSH: TSH: 1.12 u[IU]/mL (ref 0.35–5.50)

## 2024-01-23 LAB — VITAMIN B12: Vitamin B-12: 302 pg/mL (ref 211–911)

## 2024-01-23 LAB — FOLATE: Folate: 21.3 ng/mL (ref >5.9)

## 2024-01-23 LAB — VITAMIN D 25 HYDROXY (VIT D DEFICIENCY, FRACTURES): VITD: 36.5 ng/mL (ref 30.00–100.00)

## 2024-01-23 LAB — HEMOGLOBIN A1C: Hgb A1c MFr Bld: 6 % (ref 4.6–6.5)

## 2024-01-23 MED ORDER — IBANDRONATE SODIUM 150 MG PO TABS: 150.0000 mg | ORAL_TABLET | ORAL | 3 refills | Status: AC

## 2024-01-23 NOTE — Progress Notes (Signed)
 Subjective:     Patient ID: Kimberly Brown, female    DOB: 1951-03-06, 73 y.o.   MRN: [id number]  Chief Complaint  Patient presents with   Medical Management of Chronic Issues    HPI  History of Present Illness         She is here for follow-up on chronic health conditions. No labs since 2023 except vitamin D  level in 2024.  Osteoporosis-taking Boniva   History of diabetes.  HLD   Negative Cologuard in 2023  Gets eye exam every August. [place] vision   Mammogram due   Goes to [facility] dermatology   Doing squats and exercises.  Stretching exercises   Divorced     Health Maintenance Due  Topic Date Due   Colonoscopy  Never done   Hepatitis B Vaccines (3 of 3 - 19+ 3-dose series) 03/06/2008   DTaP/Tdap/Td (3 - Td or Tdap) 09/06/2017   FOOT EXAM  12/08/2017   Pneumococcal Vaccine: 50+ Years (2 of 2 - PCV) 02/18/2023   Medicare Annual Wellness (AWV)  02/18/2023    Past Medical History:  Diagnosis Date   Diabetes mellitus without complication (HCC)    Hernia of abdominal cavity    Osteoporosis 08/09/2022   Pancreatitis     Past Surgical History:  Procedure Laterality Date   ABDOMINAL HYSTERECTOMY     BASAL CELL CARCINOMA EXCISION     CHOLECYSTECTOMY N/A 12/25/2016   Procedure: LAPAROSCOPIC CHOLECYSTECTOMY WITH INTRAOPERATIVE CHOLANGIOGRAM;  Surgeon: Debby Hila, MD;  Location: [facility];  Service: General;  Laterality: N/A;    Family History  Problem Relation Age of Onset   Cancer Mother 37       rectal   Arthritis Mother    Miscarriages / [place] Mother    Cancer Father    Kidney failure Sister    Stroke Neg Hx     Social History   Socioeconomic History   Marital status: Single    Spouse name: Not on file   Number of children: Not on file   Years of education: Not on file   Highest education level: Not on file  Occupational History   Not on file  Tobacco Use   Smoking status: Former   Smokeless tobacco: Never  Substance and  Sexual Activity   Alcohol use: No   Drug use: No   Sexual activity: Never  Other Topics Concern   Not on file  Social History Narrative   Lives at home   Social Drivers of Health   Financial Resource Strain: Patient Declined (01/19/2024)   Overall Financial Resource Strain (CARDIA)    Difficulty of Paying Living Expenses: Patient declined  Food Insecurity: Patient Declined (01/19/2024)   Hunger Vital Sign    Worried About Running Out of Food in the Last Year: Patient declined    Ran Out of Food in the Last Year: Patient declined  Transportation Needs: Patient Declined (01/19/2024)   PRAPARE - Administrator, Civil Service (Medical): Patient declined    Lack of Transportation (Non-Medical): Patient declined  Physical Activity: Unknown (01/19/2024)   Exercise Vital Sign    Days of Exercise per Week: Patient declined    Minutes of Exercise per Session: Not on file  Stress: Patient Declined (01/19/2024)   [employer] of Occupational Health - Occupational Stress Questionnaire    Feeling of Stress: Patient declined  Social Connections: Unknown (01/19/2024)   Social Connection and Isolation Panel    Frequency of Communication with Friends and  Family: Patient declined    Frequency of Social Gatherings with Friends and Family: Patient declined    Attends Religious Services: Patient declined    Database administrator or Organizations: Not on file    Attends Banker Meetings: Not on file    Marital Status: Patient declined  Intimate Partner Violence: Not on file    Outpatient Medications Prior to Visit  Medication Sig Dispense Refill   blood glucose meter kit and supplies KIT Dispense based on patient and insurance preference. Use up to four times daily as directed. (FOR ICD-10 E11.9). 1 each 0   CALCIUM-MAGNESIUM-VITAMIN D  PO Take by mouth. 1000 mg of calcium  500 mg of magnesium 400 IU of Vit D     Multiple Vitamin (MULTIVITAMIN) tablet Take 1 tablet by  mouth daily. Vit D 400 IU Calcium 200 mg     Vitamin D , Ergocalciferol , (DRISDOL ) 1.25 MG (50000 UNIT) CAPS capsule Take 1 capsule (50,000 Units total) by mouth every 7 (seven) days. 12 capsule 0   ibandronate  (BONIVA ) 150 MG tablet Take 1 tablet (150 mg total) by mouth every 30 (thirty) days. Take in the morning with a full glass of water, on an empty stomach, and do not take anything else by mouth or lie down for the next 30 min. 3 tablet 3   No facility-administered medications prior to visit.    No Known Allergies  Review of Systems  Constitutional:  Negative for chills, fever, malaise/fatigue and weight loss.  Eyes:  Negative for blurred vision and double vision.  Respiratory:  Negative for shortness of breath.   Cardiovascular:  Negative for chest pain, palpitations and leg swelling.  Gastrointestinal:  Negative for abdominal pain, constipation, diarrhea, nausea and vomiting.  Genitourinary:  Positive for frequency. Negative for dysuria and urgency.  Musculoskeletal:  Negative for falls.  Neurological:  Negative for dizziness, focal weakness and headaches.  Psychiatric/Behavioral:  Negative for depression. The patient is not nervous/anxious.        Objective:    Physical Exam Constitutional:      General: She is not in acute distress.    Appearance: She is not ill-appearing.  Eyes:     Extraocular Movements: Extraocular movements intact.     Conjunctiva/sclera: Conjunctivae normal.  Cardiovascular:     Rate and Rhythm: Normal rate and regular rhythm.  Pulmonary:     Effort: Pulmonary effort is normal.     Breath sounds: Normal breath sounds.  Musculoskeletal:     Cervical back: Normal range of motion and neck supple.     Right lower leg: No edema.     Left lower leg: No edema.  Skin:    General: Skin is warm and dry.  Neurological:     General: No focal deficit present.     Mental Status: She is alert and oriented to person, place, and time.     Cranial Nerves: No  cranial nerve deficit.     Motor: No weakness.     Coordination: Coordination normal.     Gait: Gait normal.  Psychiatric:        Mood and Affect: Mood normal.        Behavior: Behavior normal.        Thought Content: Thought content normal.      BP 128/82 (BP Location: Left Arm, Patient Position: Sitting)   Pulse 70   Temp 97.9 F (36.6 C) (Temporal)   Ht 5' 4 (1.626 m)   Wt 175  lb (79.4 kg)   SpO2 97%   BMI 30.04 kg/m  Wt Readings from Last 3 Encounters:  01/23/24 175 lb (79.4 kg)  08/18/22 170 lb (77.1 kg)  02/17/22 163 lb (73.9 kg)       Assessment & Plan:   Problem List Items Addressed This Visit     Osteoporosis - Primary   Relevant Medications   ibandronate  (BONIVA ) 150 MG tablet   Vegan diet   Relevant Orders   Folate (Completed)   Magnesium (Completed)   Vitamin B12 (Completed)   Other Visit Diagnoses       Vitamin D  deficiency       Relevant Orders   VITAMIN D  25 Hydroxy (Vit-D Deficiency, Fractures) (Completed)     Diabetes mellitus in remission (HCC)       Relevant Orders   CBC with Differential/Platelet (Completed)   Comprehensive metabolic panel with GFR (Completed)   Hemoglobin A1c (Completed)   TSH (Completed)   T4, free (Completed)   Microalbumin / creatinine urine ratio (Completed)   Magnesium (Completed)   Vitamin B12 (Completed)     Pure hypercholesterolemia       Relevant Orders   Lipid panel (Completed)     Neuropathy       Relevant Orders   Magnesium (Completed)   Vitamin B12 (Completed)     Urinary frequency       Relevant Orders   Urinalysis, Routine w reflex microscopic (Completed)     Encounter for breast and pelvic examination       Relevant Orders   Ambulatory referral to Gynecology      Doing well on Boniva .  Referral to gyn for breast and pelvic health as well as to discuss possible pessary or PT pelvic floor. Continue kegels.  Diabetes has been in remission for years.  Continue getting annual eye exams.   Check labs and follow up.  Due for DEXA in 2026 as well as Cologuard.   I am having Keene HERO. Soltero maintain her blood glucose meter kit and supplies, multivitamin, CALCIUM-MAGNESIUM-VITAMIN D  PO, Vitamin D  (Ergocalciferol ), and ibandronate .  Meds ordered this encounter  Medications   ibandronate  (BONIVA ) 150 MG tablet    Sig: Take 1 tablet (150 mg total) by mouth every 30 (thirty) days. Take in the morning with a full glass of water, on an empty stomach, and do not take anything else by mouth or lie down for the next 30 min.    Dispense:  3 tablet    Refill:  3

## 2024-01-23 NOTE — Patient Instructions (Addendum)
 Please go downstairs for labs and a urine test before you leave.  You will hear from [facility] gynecology to schedule a visit.  Remember to schedule your bone density and mammogram as they are due.  I will be in touch with your results
